# Patient Record
Sex: Female | Born: 1985
Health system: Southern US, Community
[De-identification: ages and names within clinical notes are randomized; demographics above are authoritative.]

## PROBLEM LIST (undated history)

## (undated) DIAGNOSIS — R55 Syncope and collapse: Secondary | ICD-10-CM

## (undated) DIAGNOSIS — E782 Mixed hyperlipidemia: Secondary | ICD-10-CM

## (undated) DIAGNOSIS — R748 Abnormal levels of other serum enzymes: Secondary | ICD-10-CM

## (undated) DIAGNOSIS — R7301 Impaired fasting glucose: Secondary | ICD-10-CM

## (undated) HISTORY — DX: Syncope and collapse: R55

## (undated) HISTORY — PX: OTHER SURGICAL HISTORY: SHX169

## (undated) HISTORY — DX: Impaired fasting glucose: R73.01

## (undated) HISTORY — DX: Mixed hyperlipidemia: E78.2

---
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
## 2010-03-08 DIAGNOSIS — R748 Abnormal levels of other serum enzymes: Secondary | ICD-10-CM

## 2010-03-08 HISTORY — DX: Abnormal levels of other serum enzymes: R74.8

## 2010-04-10 ENCOUNTER — Other Ambulatory Visit: Payer: Self-pay | Admitting: Obstetrics & Gynecology

## 2010-04-10 DIAGNOSIS — N6451 Induration of breast: Secondary | ICD-10-CM

## 2010-04-13 ENCOUNTER — Ambulatory Visit
Admission: RE | Admit: 2010-04-13 | Discharge: 2010-04-13 | Disposition: A | Discharge status: Home / Self Care | Payer: BC Managed Care – PPO | Source: Ambulatory Visit | Attending: Obstetrics & Gynecology | Admitting: Obstetrics & Gynecology

## 2010-04-13 DIAGNOSIS — N6451 Induration of breast: Secondary | ICD-10-CM

## 2010-11-04 LAB — RPR: RPR: NONREACTIVE

## 2010-11-04 LAB — HIV ANTIBODY (ROUTINE TESTING W REFLEX): HIV: NONREACTIVE

## 2010-11-04 LAB — HEPATITIS B SURFACE ANTIGEN: Hepatitis B Surface Ag: NEGATIVE

## 2010-11-04 LAB — RUBELLA ANTIBODY, IGM: Rubella: IMMUNE

## 2011-02-15 ENCOUNTER — Ambulatory Visit (HOSPITAL_COMMUNITY)
Admission: RE | Admit: 2011-02-15 | Discharge: 2011-02-15 | Disposition: A | Discharge status: Home / Self Care | Payer: BC Managed Care – PPO | Source: Ambulatory Visit | Attending: Obstetrics & Gynecology | Admitting: Obstetrics & Gynecology

## 2011-02-15 ENCOUNTER — Other Ambulatory Visit (HOSPITAL_COMMUNITY): Payer: Self-pay | Admitting: Obstetrics & Gynecology

## 2011-02-15 DIAGNOSIS — R1011 Right upper quadrant pain: Secondary | ICD-10-CM

## 2011-02-15 DIAGNOSIS — O99891 Other specified diseases and conditions complicating pregnancy: Secondary | ICD-10-CM | POA: Insufficient documentation

## 2011-03-08 ENCOUNTER — Inpatient Hospital Stay (HOSPITAL_COMMUNITY): Admission: AD | Admit: 2011-03-08 | Payer: Self-pay | Source: Ambulatory Visit | Admitting: Obstetrics & Gynecology

## 2011-06-15 ENCOUNTER — Encounter (HOSPITAL_COMMUNITY): Payer: Self-pay | Admitting: *Deleted

## 2011-06-15 ENCOUNTER — Inpatient Hospital Stay (HOSPITAL_COMMUNITY)
Admission: AD | Admit: 2011-06-15 | Discharge: 2011-06-17 | DRG: 370 | Disposition: A | Discharge status: Home / Self Care | Payer: BC Managed Care – PPO | Source: Ambulatory Visit | Attending: Obstetrics & Gynecology | Admitting: Obstetrics & Gynecology

## 2011-06-15 ENCOUNTER — Encounter (HOSPITAL_COMMUNITY)
Admission: AD | Disposition: A | Discharge status: Home / Self Care | Payer: Self-pay | Source: Ambulatory Visit | Attending: Obstetrics & Gynecology

## 2011-06-15 ENCOUNTER — Other Ambulatory Visit: Payer: Self-pay | Admitting: Obstetrics & Gynecology

## 2011-06-15 ENCOUNTER — Encounter (HOSPITAL_COMMUNITY): Payer: Self-pay | Admitting: Anesthesiology

## 2011-06-15 ENCOUNTER — Inpatient Hospital Stay (HOSPITAL_COMMUNITY): Payer: BC Managed Care – PPO | Admitting: Anesthesiology

## 2011-06-15 DIAGNOSIS — D62 Acute posthemorrhagic anemia: Secondary | ICD-10-CM | POA: Diagnosis not present

## 2011-06-15 DIAGNOSIS — O9903 Anemia complicating the puerperium: Secondary | ICD-10-CM | POA: Diagnosis not present

## 2011-06-15 DIAGNOSIS — O322XX0 Maternal care for transverse and oblique lie, not applicable or unspecified: Secondary | ICD-10-CM | POA: Diagnosis not present

## 2011-06-15 HISTORY — DX: Abnormal levels of other serum enzymes: R74.8

## 2011-06-15 LAB — CBC
MCH: 32.1 pg (ref 26.0–34.0)
MCV: 93.2 fL (ref 78.0–100.0)
Platelets: 208 10*3/uL (ref 150–400)
RDW: 13.5 % (ref 11.5–15.5)

## 2011-06-15 SURGERY — Surgical Case
Anesthesia: Spinal

## 2011-06-15 MED ORDER — SODIUM CHLORIDE 0.9 % IV SOLN
1.0000 ug/kg/h | INTRAVENOUS | Status: DC | PRN
  Filled 2011-06-15: qty 2.5

## 2011-06-15 MED ORDER — ACETAMINOPHEN 325 MG PO TABS: 650.0000 mg | ORAL_TABLET | ORAL | Status: DC | PRN

## 2011-06-15 MED ORDER — LACTATED RINGERS IV SOLN: 500.0000 mL | Freq: Once | INTRAVENOUS | Status: DC

## 2011-06-15 MED ORDER — ONDANSETRON HCL 4 MG/2ML IJ SOLN
INTRAMUSCULAR | Status: AC
  Filled 2011-06-15: qty 2

## 2011-06-15 MED ORDER — METOCLOPRAMIDE HCL 5 MG/ML IJ SOLN: 10.0000 mg | Freq: Three times a day (TID) | INTRAMUSCULAR | Status: DC | PRN

## 2011-06-15 MED ORDER — FENTANYL 2.5 MCG/ML BUPIVACAINE 1/10 % EPIDURAL INFUSION (WH - ANES): 14.0000 mL/h | INTRAMUSCULAR | Status: DC

## 2011-06-15 MED ORDER — KETOROLAC TROMETHAMINE 60 MG/2ML IM SOLN
INTRAMUSCULAR | Status: AC
  Administered 2011-06-15: 60 mg via INTRAMUSCULAR
  Filled 2011-06-15: qty 2

## 2011-06-15 MED ORDER — NALOXONE HCL 0.4 MG/ML IJ SOLN: 0.4000 mg | INTRAMUSCULAR | Status: DC | PRN

## 2011-06-15 MED ORDER — DIBUCAINE 1 % RE OINT: 1.0000 "application " | TOPICAL_OINTMENT | RECTAL | Status: DC | PRN

## 2011-06-15 MED ORDER — NALBUPHINE SYRINGE 5 MG/0.5 ML
5.0000 mg | INJECTION | INTRAMUSCULAR | Status: DC | PRN
  Filled 2011-06-15: qty 1

## 2011-06-15 MED ORDER — DIPHENHYDRAMINE HCL 50 MG/ML IJ SOLN: 25.0000 mg | INTRAMUSCULAR | Status: DC | PRN

## 2011-06-15 MED ORDER — OXYTOCIN 20 UNITS IN LACTATED RINGERS INFUSION - SIMPLE
INTRAVENOUS | Status: AC
  Filled 2011-06-15: qty 1000

## 2011-06-15 MED ORDER — NALBUPHINE SYRINGE 5 MG/0.5 ML
INJECTION | INTRAMUSCULAR | Status: AC
  Filled 2011-06-15: qty 0.5

## 2011-06-15 MED ORDER — ONDANSETRON HCL 4 MG/2ML IJ SOLN: 4.0000 mg | Freq: Three times a day (TID) | INTRAMUSCULAR | Status: DC | PRN

## 2011-06-15 MED ORDER — WITCH HAZEL-GLYCERIN EX PADS: 1.0000 "application " | MEDICATED_PAD | CUTANEOUS | Status: DC | PRN

## 2011-06-15 MED ORDER — PHENYLEPHRINE 40 MCG/ML (10ML) SYRINGE FOR IV PUSH (FOR BLOOD PRESSURE SUPPORT): 80.0000 ug | PREFILLED_SYRINGE | INTRAVENOUS | Status: DC | PRN

## 2011-06-15 MED ORDER — ONDANSETRON HCL 4 MG/2ML IJ SOLN: 4.0000 mg | INTRAMUSCULAR | Status: DC | PRN

## 2011-06-15 MED ORDER — LANOLIN HYDROUS EX OINT: 1.0000 "application " | TOPICAL_OINTMENT | CUTANEOUS | Status: DC | PRN

## 2011-06-15 MED ORDER — NALBUPHINE SYRINGE 5 MG/0.5 ML
5.0000 mg | INJECTION | INTRAMUSCULAR | Status: DC | PRN
  Administered 2011-06-15: 10 mg via SUBCUTANEOUS
  Filled 2011-06-15: qty 1

## 2011-06-15 MED ORDER — IBUPROFEN 600 MG PO TABS
600.0000 mg | ORAL_TABLET | Freq: Four times a day (QID) | ORAL | Status: DC | PRN
  Filled 2011-06-15 (×5): qty 1

## 2011-06-15 MED ORDER — ONDANSETRON HCL 4 MG PO TABS: 4.0000 mg | ORAL_TABLET | ORAL | Status: DC | PRN

## 2011-06-15 MED ORDER — KETOROLAC TROMETHAMINE 60 MG/2ML IM SOLN
60.0000 mg | Freq: Once | INTRAMUSCULAR | Status: AC | PRN
  Administered 2011-06-15: 60 mg via INTRAMUSCULAR

## 2011-06-15 MED ORDER — MENTHOL 3 MG MT LOZG: 1.0000 | LOZENGE | OROMUCOSAL | Status: DC | PRN

## 2011-06-15 MED ORDER — PHENYLEPHRINE HCL 10 MG/ML IJ SOLN
INTRAMUSCULAR | Status: DC | PRN
  Administered 2011-06-15: 40 ug via INTRAVENOUS
  Administered 2011-06-15 (×2): 80 ug via INTRAVENOUS

## 2011-06-15 MED ORDER — OXYTOCIN 20 UNITS IN LACTATED RINGERS INFUSION - SIMPLE: 125.0000 mL/h | INTRAVENOUS | Status: AC

## 2011-06-15 MED ORDER — OXYCODONE-ACETAMINOPHEN 5-325 MG PO TABS
1.0000 | ORAL_TABLET | ORAL | Status: DC | PRN
  Administered 2011-06-16: 1 via ORAL
  Administered 2011-06-16: 2 via ORAL
  Administered 2011-06-17: 1 via ORAL
  Filled 2011-06-15: qty 1
  Filled 2011-06-15: qty 2
  Filled 2011-06-15: qty 1

## 2011-06-15 MED ORDER — PHENYLEPHRINE 40 MCG/ML (10ML) SYRINGE FOR IV PUSH (FOR BLOOD PRESSURE SUPPORT)
PREFILLED_SYRINGE | INTRAVENOUS | Status: AC
  Filled 2011-06-15: qty 5

## 2011-06-15 MED ORDER — OXYTOCIN 20 UNITS IN LACTATED RINGERS INFUSION - SIMPLE
1.0000 m[IU]/min | INTRAVENOUS | Status: DC
  Administered 2011-06-15: 2 m[IU]/min via INTRAVENOUS
  Filled 2011-06-15: qty 1000

## 2011-06-15 MED ORDER — FENTANYL CITRATE 0.05 MG/ML IJ SOLN
INTRAMUSCULAR | Status: AC
  Filled 2011-06-15: qty 2

## 2011-06-15 MED ORDER — OXYTOCIN BOLUS FROM INFUSION
500.0000 mL | Freq: Once | INTRAVENOUS | Status: DC
  Filled 2011-06-15: qty 500

## 2011-06-15 MED ORDER — FLEET ENEMA 7-19 GM/118ML RE ENEM: 1.0000 | ENEMA | RECTAL | Status: DC | PRN

## 2011-06-15 MED ORDER — TERBUTALINE SULFATE 1 MG/ML IJ SOLN: 0.2500 mg | Freq: Once | INTRAMUSCULAR | Status: DC | PRN

## 2011-06-15 MED ORDER — SCOPOLAMINE 1 MG/3DAYS TD PT72
1.0000 | MEDICATED_PATCH | Freq: Once | TRANSDERMAL | Status: DC
  Filled 2011-06-15: qty 1

## 2011-06-15 MED ORDER — OXYTOCIN 10 UNIT/ML IJ SOLN
INTRAMUSCULAR | Status: AC
  Filled 2011-06-15: qty 2

## 2011-06-15 MED ORDER — KETOROLAC TROMETHAMINE 30 MG/ML IJ SOLN: 30.0000 mg | Freq: Four times a day (QID) | INTRAMUSCULAR | Status: AC | PRN

## 2011-06-15 MED ORDER — LACTATED RINGERS IV SOLN: 500.0000 mL | INTRAVENOUS | Status: DC | PRN

## 2011-06-15 MED ORDER — CITRIC ACID-SODIUM CITRATE 334-500 MG/5ML PO SOLN
30.0000 mL | ORAL | Status: DC | PRN
  Administered 2011-06-15: 30 mL via ORAL
  Filled 2011-06-15: qty 15

## 2011-06-15 MED ORDER — DIPHENHYDRAMINE HCL 25 MG PO CAPS: 25.0000 mg | ORAL_CAPSULE | ORAL | Status: DC | PRN

## 2011-06-15 MED ORDER — EPHEDRINE SULFATE 50 MG/ML IJ SOLN
INTRAMUSCULAR | Status: DC | PRN
  Administered 2011-06-15: 10 mg via INTRAVENOUS

## 2011-06-15 MED ORDER — LIDOCAINE HCL (PF) 1 % IJ SOLN: 30.0000 mL | INTRAMUSCULAR | Status: DC | PRN

## 2011-06-15 MED ORDER — MORPHINE SULFATE (PF) 0.5 MG/ML IJ SOLN
INTRAMUSCULAR | Status: DC | PRN
  Administered 2011-06-15: .1 ug via INTRATHECAL

## 2011-06-15 MED ORDER — EPHEDRINE 5 MG/ML INJ
INTRAVENOUS | Status: AC
  Filled 2011-06-15: qty 10

## 2011-06-15 MED ORDER — ZOLPIDEM TARTRATE 5 MG PO TABS: 5.0000 mg | ORAL_TABLET | Freq: Every evening | ORAL | Status: DC | PRN

## 2011-06-15 MED ORDER — DIPHENHYDRAMINE HCL 50 MG/ML IJ SOLN: 12.5000 mg | INTRAMUSCULAR | Status: DC | PRN

## 2011-06-15 MED ORDER — SIMETHICONE 80 MG PO CHEW: 80.0000 mg | CHEWABLE_TABLET | ORAL | Status: DC | PRN

## 2011-06-15 MED ORDER — EPHEDRINE 5 MG/ML INJ: 10.0000 mg | INTRAVENOUS | Status: DC | PRN

## 2011-06-15 MED ORDER — OXYCODONE-ACETAMINOPHEN 5-325 MG PO TABS: 1.0000 | ORAL_TABLET | ORAL | Status: DC | PRN

## 2011-06-15 MED ORDER — IBUPROFEN 600 MG PO TABS: 600.0000 mg | ORAL_TABLET | Freq: Four times a day (QID) | ORAL | Status: DC | PRN

## 2011-06-15 MED ORDER — BUPIVACAINE IN DEXTROSE 0.75-8.25 % IT SOLN
INTRATHECAL | Status: DC | PRN
  Administered 2011-06-15: 1.5 mL via INTRATHECAL

## 2011-06-15 MED ORDER — ONDANSETRON HCL 4 MG/2ML IJ SOLN
INTRAMUSCULAR | Status: DC | PRN
  Administered 2011-06-15: 4 mg via INTRAVENOUS

## 2011-06-15 MED ORDER — FENTANYL CITRATE 0.05 MG/ML IJ SOLN: 25.0000 ug | INTRAMUSCULAR | Status: DC | PRN

## 2011-06-15 MED ORDER — CEFAZOLIN SODIUM 1-5 GM-% IV SOLN
1.0000 g | Freq: Once | INTRAVENOUS | Status: DC
  Administered 2011-06-15: 1 g via INTRAVENOUS
  Filled 2011-06-15: qty 50

## 2011-06-15 MED ORDER — MORPHINE SULFATE 0.5 MG/ML IJ SOLN
INTRAMUSCULAR | Status: AC
  Filled 2011-06-15: qty 10

## 2011-06-15 MED ORDER — LACTATED RINGERS IV SOLN
INTRAVENOUS | Status: DC
  Administered 2011-06-15: 125 mL/h via INTRAVENOUS
  Administered 2011-06-15 (×2): via INTRAVENOUS
  Administered 2011-06-15: 125 mL/h via INTRAVENOUS

## 2011-06-15 MED ORDER — CEFAZOLIN SODIUM 1-5 GM-% IV SOLN
INTRAVENOUS | Status: AC
  Filled 2011-06-15: qty 50

## 2011-06-15 MED ORDER — LACTATED RINGERS IV SOLN
INTRAVENOUS | Status: DC
  Administered 2011-06-16: 03:00:00 via INTRAVENOUS

## 2011-06-15 MED ORDER — FENTANYL CITRATE 0.05 MG/ML IJ SOLN
INTRAMUSCULAR | Status: DC | PRN
  Administered 2011-06-15: 15 ug via INTRATHECAL

## 2011-06-15 MED ORDER — TETANUS-DIPHTH-ACELL PERTUSSIS 5-2.5-18.5 LF-MCG/0.5 IM SUSP
0.5000 mL | Freq: Once | INTRAMUSCULAR | Status: AC
  Administered 2011-06-16: 0.5 mL via INTRAMUSCULAR
  Filled 2011-06-15: qty 0.5

## 2011-06-15 MED ORDER — SENNOSIDES-DOCUSATE SODIUM 8.6-50 MG PO TABS
2.0000 | ORAL_TABLET | Freq: Every day | ORAL | Status: DC
  Administered 2011-06-15 – 2011-06-16 (×2): 2 via ORAL

## 2011-06-15 MED ORDER — PRENATAL MULTIVITAMIN CH
1.0000 | ORAL_TABLET | Freq: Every day | ORAL | Status: DC
  Administered 2011-06-16 – 2011-06-17 (×2): 1 via ORAL
  Filled 2011-06-15 (×2): qty 1

## 2011-06-15 MED ORDER — SIMETHICONE 80 MG PO CHEW
80.0000 mg | CHEWABLE_TABLET | Freq: Three times a day (TID) | ORAL | Status: DC
  Administered 2011-06-15 – 2011-06-17 (×6): 80 mg via ORAL

## 2011-06-15 MED ORDER — NALBUPHINE SYRINGE 5 MG/0.5 ML
INJECTION | INTRAMUSCULAR | Status: AC
  Administered 2011-06-15: 10 mg via SUBCUTANEOUS
  Filled 2011-06-15: qty 0.5

## 2011-06-15 MED ORDER — IBUPROFEN 600 MG PO TABS
600.0000 mg | ORAL_TABLET | Freq: Four times a day (QID) | ORAL | Status: DC
  Administered 2011-06-16 – 2011-06-17 (×7): 600 mg via ORAL
  Filled 2011-06-15 (×2): qty 1

## 2011-06-15 MED ORDER — OXYTOCIN 20 UNITS IN LACTATED RINGERS INFUSION - SIMPLE: 125.0000 mL/h | Freq: Once | INTRAVENOUS | Status: DC

## 2011-06-15 MED ORDER — SODIUM CHLORIDE 0.9 % IJ SOLN: 3.0000 mL | INTRAMUSCULAR | Status: DC | PRN

## 2011-06-15 MED ORDER — OXYTOCIN 20 UNITS IN LACTATED RINGERS INFUSION - SIMPLE
INTRAVENOUS | Status: DC | PRN
  Administered 2011-06-15: 20 [IU] via INTRAVENOUS

## 2011-06-15 MED ORDER — DIPHENHYDRAMINE HCL 25 MG PO CAPS: 25.0000 mg | ORAL_CAPSULE | Freq: Four times a day (QID) | ORAL | Status: DC | PRN

## 2011-06-15 MED ORDER — ONDANSETRON HCL 4 MG/2ML IJ SOLN: 4.0000 mg | Freq: Four times a day (QID) | INTRAMUSCULAR | Status: DC | PRN

## 2011-06-15 MED ORDER — MEPERIDINE HCL 25 MG/ML IJ SOLN: 6.2500 mg | INTRAMUSCULAR | Status: DC | PRN

## 2011-06-15 SURGICAL SUPPLY — 37 items
BENZOIN TINCTURE PRP APPL 2/3 (GAUZE/BANDAGES/DRESSINGS) IMPLANT
CHLORAPREP W/TINT 26ML (MISCELLANEOUS) ×2 IMPLANT
CLOTH BEACON ORANGE TIMEOUT ST (SAFETY) ×2 IMPLANT
CONTAINER PREFILL 10% NBF 15ML (MISCELLANEOUS) IMPLANT
DRESSING TELFA 8X3 (GAUZE/BANDAGES/DRESSINGS) ×2 IMPLANT
ELECT REM PT RETURN 9FT ADLT (ELECTROSURGICAL) ×2
ELECTRODE REM PT RTRN 9FT ADLT (ELECTROSURGICAL) ×1 IMPLANT
EXTRACTOR VACUUM KIWI (MISCELLANEOUS) IMPLANT
EXTRACTOR VACUUM M CUP 4 TUBE (SUCTIONS) IMPLANT
GAUZE SPONGE 4X4 12PLY STRL LF (GAUZE/BANDAGES/DRESSINGS) ×4 IMPLANT
GLOVE BIO SURGEON STRL SZ7 (GLOVE) ×2 IMPLANT
GLOVE BIOGEL PI IND STRL 7.0 (GLOVE) ×2 IMPLANT
GLOVE BIOGEL PI INDICATOR 7.0 (GLOVE) ×2
GOWN PREVENTION PLUS LG XLONG (DISPOSABLE) ×6 IMPLANT
KIT ABG SYR 3ML LUER SLIP (SYRINGE) IMPLANT
NEEDLE HYPO 25X5/8 SAFETYGLIDE (NEEDLE) IMPLANT
NS IRRIG 1000ML POUR BTL (IV SOLUTION) ×2 IMPLANT
PACK C SECTION WH (CUSTOM PROCEDURE TRAY) ×2 IMPLANT
PAD ABD 7.5X8 STRL (GAUZE/BANDAGES/DRESSINGS) ×2 IMPLANT
RTRCTR C-SECT PINK 25CM LRG (MISCELLANEOUS) IMPLANT
SLEEVE SCD COMPRESS KNEE MED (MISCELLANEOUS) IMPLANT
STAPLER VISISTAT 35W (STAPLE) IMPLANT
STRIP CLOSURE SKIN 1/4X4 (GAUZE/BANDAGES/DRESSINGS) IMPLANT
SUT MNCRL 0 VIOLET CTX 36 (SUTURE) ×3 IMPLANT
SUT MONOCRYL 0 CTX 36 (SUTURE) ×3
SUT PLAIN 0 NONE (SUTURE) IMPLANT
SUT PLAIN 2 0 (SUTURE)
SUT PLAIN ABS 2-0 CT1 27XMFL (SUTURE) IMPLANT
SUT VIC AB 0 CT1 27 (SUTURE) ×2
SUT VIC AB 0 CT1 27XBRD ANBCTR (SUTURE) ×2 IMPLANT
SUT VIC AB 2-0 CT1 27 (SUTURE) ×2
SUT VIC AB 2-0 CT1 TAPERPNT 27 (SUTURE) ×2 IMPLANT
SUT VIC AB 4-0 KS 27 (SUTURE) IMPLANT
SUT VICRYL 0 TIES 12 18 (SUTURE) IMPLANT
TOWEL OR 17X24 6PK STRL BLUE (TOWEL DISPOSABLE) ×4 IMPLANT
TRAY FOLEY CATH 14FR (SET/KITS/TRAYS/PACK) IMPLANT
WATER STERILE IRR 1000ML POUR (IV SOLUTION) ×2 IMPLANT

## 2011-06-15 NOTE — Progress Notes (Signed)
1314 FHR decreased to 90 bpm for 3 minutes then gradually returned to baseline.  Dr. Juliene Pina notified.

## 2011-06-15 NOTE — Progress Notes (Signed)
Dr. Juliene Pina at bedside and notified of pf status, SVE, FHR, prolonged deceleration, RN interventions, oxygen applied, UC pattern, and pitocin amount.  Dr. Juliene Pina explained to pt and family the possible need for a c-section delivery.  Pt and family verbalized understandings, will continue to monitor.

## 2011-06-15 NOTE — Progress Notes (Signed)
Dr. Juliene Pina notified of pt status, FHR, decreased variability, late decelerations noted, UC pattern, RN interventions, and pitocin amount.  Orders received to D/C pitocin and prep pt for a c-section delivery.  Will continue to monitor.

## 2011-06-15 NOTE — H&P (Signed)
JASARA CORRIGAN is a 26 y.o. female presenting at 40 wks for IOL trial for oblique lie/ floating head to see if contractions will help fetal engagement. Patient requesting IOL Ob course complicated by elevated LFT, that normalized after n/v resolved. High wt gain. Normal fetal growth.   Maternal Medical History:  Fetal activity: Perceived fetal activity is normal.    Prenatal complications: No bleeding or hypertension.     OB History    Grav Para Term Preterm Abortions TAB SAB Ect Mult Living   1 0 0 0 0 0 0 0 0 0      Past Medical History  Diagnosis Date  . Elevated liver enzymes 2012   Past Surgical History  Procedure Date  . Wisdom tooth extraction 2008   Family History: family history includes Cancer in her mother and Heart failure in her maternal grandmother. Social History:  reports that she has never smoked. She has never used smokeless tobacco. She reports that she does not drink alcohol or use illicit drugs.  ROS  No HA/vision changes/ RUQ pain/LE swelling.    Exam Physical Exam  Blood pressure 121/75, pulse 89, temperature 98.1 F (36.7 C), temperature source Oral, resp. rate 18, height 5' (1.524 m), weight 69.854 kg (154 lb).  A&O x 3, no acute distress. Pleasant HEENT neg, no thyromegaly Lungs CTA bilat CV RRR, S1S2 normal Abdo soft, non tender, non acute, Vtx, ballotable, oblique lie.  Extr no edema/ tenderness Pelvic Dilation: 1.5, Effacement (%): 40, Station: -3/ballotable (Exam by:: Dr. Juliene Pina  FHT  140s/ + accesl/ no decels. Mod variab. Intermittent variable decels with few late decels with UCs.  Toco q 2-4 min on pitocin that was turned off since late decels noted.   Prenatal labs: ABO, Rh: A/Positive/-- (08/29 0000) Antibody: Negative (08/29 0000) Rubella: Immune (08/29 0000) RPR: Nonreactive (08/29 0000)  HBsAg: Negative (08/29 0000)  HIV: Non-reactive (08/29 0000)  GBS: Negative (03/15 0000)  Sono - normal female, nl placenta and cord.  Screening  QUAD negative.   Assessment/Plan: 26 yo, G1 at 40 wks, IOL for oblique lie, attempt to convert to longitudinal lie and assess for fetal descent. None noted. FHT noted variable decels and some late decels with ongoing UCs, hence pitocin turned off and we will proceed with c-section.   Risks/complications of surgery reviewed incl infection, bleeding, damage to internal organs including bladder, bowels, ureters, blood vessels, other risks from anesthesia, VTE and delayed complications of any surgery, complications in future surgery reviewed. Also discussed neonatal complications incl difficult delivery, laceration, vacuum assistance, TTN etc. Pt understands and agrees, all concerns addressed.     Bardia Wangerin R 06/15/2011, 3:36 PM

## 2011-06-15 NOTE — Anesthesia Procedure Notes (Signed)
Spinal  Patient location during procedure: OR Start time: 06/15/2011 4:07 PM Staffing Performed by: anesthesiologist  Preanesthetic Checklist Completed: patient identified, site marked, surgical consent, pre-op evaluation, timeout performed, IV checked, risks and benefits discussed and monitors and equipment checked Spinal Block Patient position: sitting Prep: site prepped and draped and DuraPrep Patient monitoring: heart rate, cardiac monitor, continuous pulse ox and blood pressure Approach: midline Location: L3-4 Injection technique: single-shot Needle Needle type: Sprotte  Needle gauge: 24 G Needle length: 9 cm Assessment Sensory level: T4 Additional Notes Clear free flow CSF on first attempt.  No paresthesia.  Patient tolerated procedure well. Jasmine December, MD

## 2011-06-15 NOTE — Anesthesia Preprocedure Evaluation (Signed)
Anesthesia Evaluation  Patient identified by MRN, date of birth, ID band Patient awake    Reviewed: Allergy & Precautions, H&P , NPO status , Patient's Chart, lab work & pertinent test results, reviewed documented beta blocker date and time   History of Anesthesia Complications Negative for: history of anesthetic complications  Airway Mallampati: III TM Distance: >3 FB Neck ROM: full    Dental  (+) Teeth Intact   Pulmonary neg pulmonary ROS,  breath sounds clear to auscultation        Cardiovascular negative cardio ROS  Rhythm:regular Rate:Normal     Neuro/Psych negative neurological ROS  negative psych ROS   GI/Hepatic negative GI ROS, Neg liver ROS,   Endo/Other  negative endocrine ROS  Renal/GU negative Renal ROS     Musculoskeletal   Abdominal   Peds  Hematology negative hematology ROS (+)   Anesthesia Other Findings   Reproductive/Obstetrics (+) Pregnancy                           Anesthesia Physical Anesthesia Plan  ASA: II  Anesthesia Plan: Spinal   Post-op Pain Management:    Induction:   Airway Management Planned:   Additional Equipment:   Intra-op Plan:   Post-operative Plan:   Informed Consent: I have reviewed the patients History and Physical, chart, labs and discussed the procedure including the risks, benefits and alternatives for the proposed anesthesia with the patient or authorized representative who has indicated his/her understanding and acceptance.     Plan Discussed with:   Anesthesia Plan Comments:         Anesthesia Quick Evaluation

## 2011-06-15 NOTE — Op Note (Signed)
Cesarean Section Procedure Note   Christine Garner, 06/15/2011  Indications: Oblique lie, fetal intolerance to contractions   Pre-operative Diagnosis: 40 wks, Oblique Lie.  Post-operative Diagnosis: Same  Surgeon: Surgeon(s) and Role:  Robley Fries, MD - Primary  Assistants: none Anesthesia: spinal   Procedure Details:  The patient was seen in labor room. Patient had pitocin induced contractions to see if fetal lie would turn to longitudinal from oblique lie and allow vaginal delivery attempt. FHT noted noted variable and occasional late decelerations and no fetal engagement noted. Pitocin was stopped and cesarean delivery was advised. The risks, benefits, complications, treatment options, and expected outcomes were discussed with the patient. The patient concurred with the proposed plan, giving informed consent. identified as Christine Garner and the procedure verified as C-Section Delivery. She was brought to the Operating room. A Time Out was held and the above information confirmed. She received 1 gm Ancef IV.  After induction of spinal anesthesia, the patient was prepped in the usual sterile manner, foley was placed and she was draped in usual sterile fashion.  A Pfannenstiel incision was made and carried down through the subcutaneous tissue to the fascia. Fascial incision was made and extended transversely. The fascia was separated from the underlying rectus tissue superiorly and inferiorly. The peritoneum was identified and entered. Peritoneal incision was extended longitudinally. The utero-vesical peritoneal reflection was incised transversely and the bladder flap was bluntly freed from the lower uterine segment. A low transverse uterine incision was made. Amniotic fluid was clear. Delivered from cephalic presentation (ROT) was a healthy, vigorous FEMALE infant. Apgar scores of 9 at one minute and 9 at five minutes. Cord ph was not sent the umbilical cord was clamped and cut cord blood was  obtained for evaluation. The placenta was removed Intact and appeared normal. The uterine outline, tubes and ovaries appeared normal}. The uterine incision was closed with running locked sutures of 0Vicryl followed by imbricating layer.  Hemostasis was observed. Lavage was carried out until clear. Peritoneum closed using 2-0 Vicryl. Pyramidalis sutured in midline. The fascia was then reapproximated with running sutures of 0Vicryl. The subcuticular closure was performed using 3-0Vicryl. Steristrips applied and sterile dressing placed.  Instrument, sponge, and needle counts were correct prior the abdominal closure and were correct at the conclusion of the case.   Findings: Female infant, delivered cephalic, was noted in ROT, floating position. Apgars 9/9, Weight pending. Normal placenta, 3 vessel cord. Normal tubes and ovaries.    Estimated Blood Loss: 800 cc Total IV Fluids: 2500 cc  Urine Output: 300 cc clear urine Specimens: Cord blood.   Complications: no complications Disposition: PACU - hemodynamically stable.  Maternal Condition: stable  Baby condition / location:  nursery-stable  Attending Attestation: I performed the procedure.   Signed: Surgeon(s): Robley Fries, MD

## 2011-06-15 NOTE — Transfer of Care (Signed)
Immediate Anesthesia Transfer of Care Note  Patient: Christine Garner  Procedure(s) Performed: Procedure(s) (LRB): CESAREAN SECTION (N/A)  Patient Location: PACU  Anesthesia Type: Spinal  Level of Consciousness: awake  Airway & Oxygen Therapy: Patient Spontanous Breathing  Post-op Assessment: Report given to PACU RN  Post vital signs: Reviewed and stable  Complications: No apparent anesthesia complications

## 2011-06-15 NOTE — Anesthesia Postprocedure Evaluation (Signed)
Anesthesia Post Note  Patient: Christine Garner  Procedure(s) Performed: Procedure(s) (LRB): CESAREAN SECTION (N/A)  Anesthesia type: Spinal  Patient location: PACU  Post pain: Pain level controlled  Post assessment: Post-op Vital signs reviewed  Last Vitals:  Filed Vitals:   06/15/11 1745  BP:   Pulse:   Temp:   Resp: 18    Post vital signs: Reviewed  Level of consciousness: awake  Complications: No apparent anesthesia complications

## 2011-06-16 ENCOUNTER — Encounter (HOSPITAL_COMMUNITY): Payer: Self-pay | Admitting: Obstetrics & Gynecology

## 2011-06-16 LAB — CBC
HCT: 30 % — ABNORMAL LOW (ref 36.0–46.0)
Hemoglobin: 10.1 g/dL — ABNORMAL LOW (ref 12.0–15.0)
MCV: 94.3 fL (ref 78.0–100.0)
Platelets: 168 10*3/uL (ref 150–400)
RBC: 3.18 MIL/uL — ABNORMAL LOW (ref 3.87–5.11)
WBC: 11.1 10*3/uL — ABNORMAL HIGH (ref 4.0–10.5)

## 2011-06-16 NOTE — Progress Notes (Signed)
Patient ID: VALOREE AGENT, female   DOB: 1986/02/21, 26 y.o.   MRN: [id number]  POSTOPERATIVE DAY # 1 S/P cesarean section   S:         Reports feeling tired             Tolerating po intake / no nausea / no vomiting / no flatus / no BM             Bleeding is light             Pain controlled with long acting narcotic             Up ad lib / ambulatory  Newborn bottle-feeding  / female newborn   O:  A & O x 3              VS: Blood pressure 115/73, pulse 76, temperature 98.2 F (36.8 C), temperature source Oral, resp. rate 18, height 5' (1.524 m), weight 69.854 kg (154 lb), SpO2 98.00%, unknown if currently breastfeeding.  LABS: WBC/Hgb/Hct/Plts:  11.1/10.1/30.0/168 (04/10 0540)   I&O:   negative  Lungs: Clear and unlabored  Heart: regular rate and rhythm / no mumurs  Abdomen: soft, non-tender, non-distended, bowel sounds active             Fundus: firm, non-tender, U-1             Dressing intact             Perineum: no edema  Lochia: light  Extremities: no edema, no calf pain or tenderness  A:        POD # 1 S/P cesarean section            Mild acute blood anemia - stable hemodynamically  P:        Routine postoperative care              Advance activity as tolerated   Shadi Sessler 06/16/2011, 8:47 AM

## 2011-06-16 NOTE — Anesthesia Postprocedure Evaluation (Signed)
  Anesthesia Post Note  Patient: Christine Garner  Procedure(s) Performed: Procedure(s) (LRB): CESAREAN SECTION (N/A)  Anesthesia type: Spinal  Patient location: Mother/Baby  Post pain: Pain level controlled  Post assessment: Post-op Vital signs reviewed  Last Vitals:  Filed Vitals:   06/16/11 0645  BP: 115/73  Pulse: 76  Temp: 36.8 C  Resp: 18    Post vital signs: Reviewed  Level of consciousness: awake  Complications: No apparent anesthesia complications

## 2011-06-16 NOTE — Addendum Note (Signed)
Addendum  created 06/16/11 1407 by Algis Greenhouse, CRNA   Modules edited:Notes Section

## 2011-06-17 MED ORDER — OXYCODONE-ACETAMINOPHEN 5-325 MG PO TABS: 1.0000 | ORAL_TABLET | ORAL | Status: AC | PRN

## 2011-06-17 MED ORDER — IBUPROFEN 600 MG PO TABS: 600.0000 mg | ORAL_TABLET | Freq: Four times a day (QID) | ORAL | Status: AC | PRN

## 2011-06-17 NOTE — Discharge Summary (Signed)
Pt seen, doing well, agree w above note and plan

## 2011-06-17 NOTE — Progress Notes (Signed)
duplicate

## 2011-06-17 NOTE — Progress Notes (Signed)
Patient ID: Christine Garner, female   DOB: 1985/03/24, 26 y.o.   MRN: [id number]  POSTOPERATIVE DAY # 2 S/P cesarean section   S:         Reports feeling well - desires discharge home today             Tolerating po intake / no nausea / no vomiting / + flatus / no BM             Bleeding is light             Pain controlled with motrin and percocet             Up ad lib / ambulatory / voiding QS  Newborn bottle- feeding only     O:  A & O x 3 NAD             VS: Blood pressure 103/67, pulse 75, temperature 98.1 F (36.7 C), temperature source Oral, resp. rate 18, height 5' (1.524 m), weight 69.854 kg (154 lb), SpO2 98.00%, unknown if currently breastfeeding.  Lungs: Clear and unlabored  Heart: regular rate and rhythm / no mumurs  Abdomen: soft, non-tender, non-distended, bowel sounds active             Fundus: firm, non-tender, U-1             Dressing OFF              Incision:  approximated with subcuticular / no erythema / no ecchymosis / no drainage  Perineum: no edema  Lochia: light  Extremities: trace edema, no calf pain or tenderness, negative Homans  A:        POD # 2 S/P cesarean section            Stable status  P:        Routine postoperative care              Discharge home - [facility] instructions reviewed     Marlinda Mike 06/17/2011, 8:39 AM

## 2011-06-17 NOTE — Discharge Summary (Signed)
POSTOPERATIVE DISCHARGE SUMMARY:  Patient ID: Christine Garner MRN: [id number] DOB/AGE: [birth year] 25 y.o.  Admit date: 06/15/2011 Discharge date:  06/17/2011  Admission Diagnoses: 39 weeks oblique lie - elective induction of labor    Discharge Diagnoses:   Term Pregnancy-delivered  POD 2 s/p cesarean section (fetal intolerance to labor)  Prenatal history: G1P1001   EDC : 06/16/2011, by Other Basis  Prenatal care at [facility]  Primary provider : Mody Prenatal course complicated by N&V with elevated LFT - resolved / oblique lie / excessive maternal weight gain (40 pounds)  Prenatal Labs: ABO, Rh: A (08/29 0000) positive Antibody: Negative (08/29 0000) Rubella: Immune (08/29 0000)  RPR: NON REACTIVE (04/09 1045)  HBsAg: Negative (08/29 0000)  HIV: Non-reactive (08/29 0000)  GBS: Negative (03/15 0000)  1 hr Glucola :nl   Medical / Surgical History :  Past medical history:  Past Medical History  Diagnosis Date  . Elevated liver enzymes 2012    Past surgical history:  Past Surgical History  Procedure Date  . Wisdom tooth extraction 2008  . Cesarean section 06/15/2011    Procedure: CESAREAN SECTION;  Surgeon: Robley Fries, MD;  Location: [facility];  Service: Gynecology;  Laterality: N/A;    Family History:  Family History  Problem Relation Age of Onset  . Cancer Mother     uterine  . Heart failure Maternal Grandmother     Social History:  reports that she has never smoked. She has never used smokeless tobacco. She reports that she does not drink alcohol or use illicit drugs.   Allergies: Review of patient's allergies indicates no known allergies.    Current Medications at time of admission:   Prior to Admission medications   Medication Sig Start Date End Date Taking? Authorizing Provider  Prenatal Vit-Fe Fumarate-FA (PRENATAL MULTIVITAMIN) TABS Take 1 tablet by mouth daily.   Yes Historical Provider, MD                    Intrapartum  Course: Admit for IOL - oblique lie with high fetal station / pitocin protocol / AROM / FHR decels - fetal intolerance to labor.  Procedures: Cesarean section delivery of female newborn by Dr Juliene Pina  See operative report for further details  Postoperative / postpartum course: uneventful - early discharge POD 2  Physical Exam:  Normal limits - trace dependent edema / no evidence of DVT / Lungs clear VSS: Blood pressure 103/67, pulse 75, temperature 98.1 F (36.7 C), temperature source Oral, resp. rate 18, height 5' (1.524 m), weight 69.854 kg (154 lb), SpO2 98.00%, unknown if currently breastfeeding.  LABS:   preop hgb: 12.8 /   Postop hgb: 10.1   Incision:  approximated with subcuticular & steri-strips / no erythema / no ecchymosis / nodrainage  Discharge Instructions:  Discharged Condition: stable Activity: pelvic rest and postoperative restrictions x 2 weeks Diet: routine Medications: see below Medication List  As of 06/17/2011  8:44 AM   TAKE these medications         ibuprofen 600 MG tablet   Commonly known as: ADVIL,MOTRIN   Take 1 tablet (600 mg total) by mouth every 6 (six) hours as needed.      oxyCODONE-acetaminophen 5-325 MG per tablet   Commonly known as: PERCOCET   Take 1-2 tablets by mouth every 4 (four) hours as needed (moderate - severe pain).      prenatal multivitamin Tabs   Take 1 tablet by mouth daily.  Condition: stable Postpartum Instructions: refer to practice specific booklet Discharge to: home Disposition: Final discharge disposition not confirmed Follow up :  Follow-up Information    Follow up with MODY,VAISHALI R, MD. Schedule an appointment as soon as possible for a visit in 6 weeks.   Contact information:   [address] [place] [address] [phone]           Signed: Marlinda Mike 06/17/2011, 8:44 AM

## 2012-04-03 ENCOUNTER — Ambulatory Visit (INDEPENDENT_AMBULATORY_CARE_PROVIDER_SITE_OTHER): Payer: BC Managed Care – PPO | Admitting: Family Medicine

## 2012-04-03 VITALS — BP 115/78 | HR 83 | Temp 98.5°F | Resp 16 | Ht 60.0 in | Wt 116.0 lb

## 2012-04-03 DIAGNOSIS — R509 Fever, unspecified: Secondary | ICD-10-CM

## 2012-04-03 DIAGNOSIS — J069 Acute upper respiratory infection, unspecified: Secondary | ICD-10-CM

## 2012-04-03 NOTE — Patient Instructions (Addendum)
Viral Syndrome  You or your child has Viral Syndrome. It is the most common infection causing "colds" and infections in the nose, throat, sinuses, and breathing tubes. Sometimes the infection causes nausea, vomiting, or diarrhea. The germ that causes the infection is a virus. No antibiotic or other medicine will kill it. There are medicines that you can take to make you or your child more comfortable.   HOME CARE INSTRUCTIONS    Rest in bed until you start to feel better.   If you have diarrhea or vomiting, eat small amounts of crackers and toast. Soup is helpful.   Do not give aspirin or medicine that contains aspirin to children.   Only take over-the-counter or prescription medicines for pain, discomfort, or fever as directed by your caregiver.  SEEK IMMEDIATE MEDICAL CARE IF:    You or your child has not improved within one week.   You or your child has pain that is not at least partially relieved by over-the-counter medicine.   Thick, colored mucus or blood is coughed up.   Discharge from the nose becomes thick yellow or green.   Diarrhea or vomiting gets worse.   There is any major change in your or your child's condition.   You or your child develops a skin rash, stiff neck, severe headache, or are unable to hold down food or fluid.   You or your child has an oral temperature above 102 F (38.9 C), not controlled by medicine.   Your baby is older than 3 months with a rectal temperature of 102 F (38.9 C) or higher.   Your baby is 3 months old or younger with a rectal temperature of 100.4 F (38 C) or higher.  Document Released: 02/07/2006 Document Revised: 05/17/2011 Document Reviewed: 02/08/2007  Exit[employer] Patient Information 2013 [employer].

## 2012-04-03 NOTE — Progress Notes (Signed)
Subjective:    Patient ID: Christine Garner, female    DOB: September 01, 1985, 27 y.o.   MRN: [id number]  HPI  Christine Garner is a delightful 27 yo woman who has a 25 mo baby girl at home - [place]. She did not get her flu shot this yr and she does work in Science [employer].  She began to get ill with nasal congestion, fatigue, scratchy throat about 3d prev but yesterday she developed neck pain and stiffness which has worsened and today she had a temp up to 99.8 so decided to come in for a flu test to ensure she was not exposing her infant.  Past Medical History  Diagnosis Date  . Elevated liver enzymes 2012   Current Outpatient Prescriptions on File Prior to Visit  Medication Sig Dispense Refill  . Drospirenone-Ethinyl Estradiol-Levomefol (BEYAZ) 3-0.02-0.451 MG tablet Take 1 tablet by mouth daily.      . Prenatal Vit-Fe Fumarate-FA (PRENATAL MULTIVITAMIN) TABS Take 1 tablet by mouth daily.       No Known Allergies  Review of Systems  Constitutional: Positive for chills, activity change and fatigue. Negative for fever, diaphoresis and appetite change.  HENT: Positive for congestion, sore throat, neck pain and neck stiffness. Negative for ear pain, nosebleeds, rhinorrhea, sneezing, mouth sores, trouble swallowing, voice change, postnasal drip, sinus pressure and ear discharge.   Respiratory: Positive for cough. Negative for shortness of breath.   Cardiovascular: Negative for chest pain.  Gastrointestinal: Negative for nausea, vomiting, abdominal pain, diarrhea and constipation.  Genitourinary: Negative for dysuria, frequency and decreased urine volume.  Musculoskeletal: Positive for myalgias. Negative for back pain, joint swelling, arthralgias and gait problem.  Neurological: Positive for headaches. Negative for dizziness, syncope, weakness and light-headedness.  Hematological: Negative for adenopathy.  Psychiatric/Behavioral: Positive for sleep disturbance.      BP 115/78  Pulse 83  Temp 98.5 F (36.9  C)  Resp 16  Ht 5' (1.524 m)  Wt 116 lb (52.617 kg)  BMI 22.65 kg/m2  SpO2 100%  LMP 03/20/2012  Breastfeeding? No Objective:   Physical Exam  Constitutional: She is oriented to person, place, and time. She appears well-developed and well-nourished. No distress.  HENT:  Head: Normocephalic and atraumatic.  Right Ear: Tympanic membrane, external ear and ear canal normal.  Left Ear: Tympanic membrane, external ear and ear canal normal.  Nose: Rhinorrhea present. No mucosal edema.  Mouth/Throat: Uvula is midline and mucous membranes are normal. Posterior oropharyngeal erythema present. No oropharyngeal exudate, posterior oropharyngeal edema or tonsillar abscesses.  Eyes: Conjunctivae normal are normal. Right eye exhibits no discharge. Left eye exhibits no discharge. No scleral icterus.  Neck: Normal range of motion. Neck supple.  Cardiovascular: Normal rate, regular rhythm, normal heart sounds and intact distal pulses.   Pulmonary/Chest: Effort normal and breath sounds normal.  Lymphadenopathy:       Head (right side): No submandibular, no tonsillar, no preauricular and no posterior auricular adenopathy present.       Head (left side): No submandibular, no tonsillar, no preauricular and no posterior auricular adenopathy present.    She has cervical adenopathy.       Right cervical: Superficial cervical adenopathy present.       Left cervical: Superficial cervical adenopathy present.       Right: Supraclavicular adenopathy present.       Left: No supraclavicular adenopathy present.  Neurological: She is alert and oriented to person, place, and time.  Skin: Skin is warm and dry. She is  not diaphoretic. No erythema.  Psychiatric: She has a normal mood and affect. Her behavior is normal.      Results for orders placed in visit on 04/03/12  POCT INFLUENZA A/B      Component Value Range   Influenza A, POC Negative     Influenza B, POC Negative      Assessment & Plan:  1. URI - Rec  symptomatic care

## 2013-01-11 IMAGING — US US ABDOMEN COMPLETE
1 series · 14 of 25 positions shown · non-contrast
Comparison: None.

CLINICAL DATA: 22 weeks pregnant, right upper quadrant abdominal
pain

ABDOMINAL ULTRASOUND COMPLETE

[Series 1: us abdomen complete · 14 of 86 slices shown]
[im 1/86]
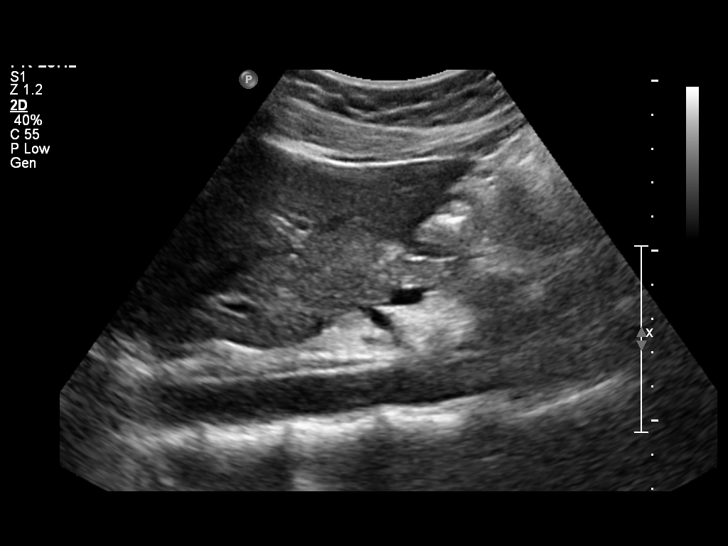
[im 8/86]
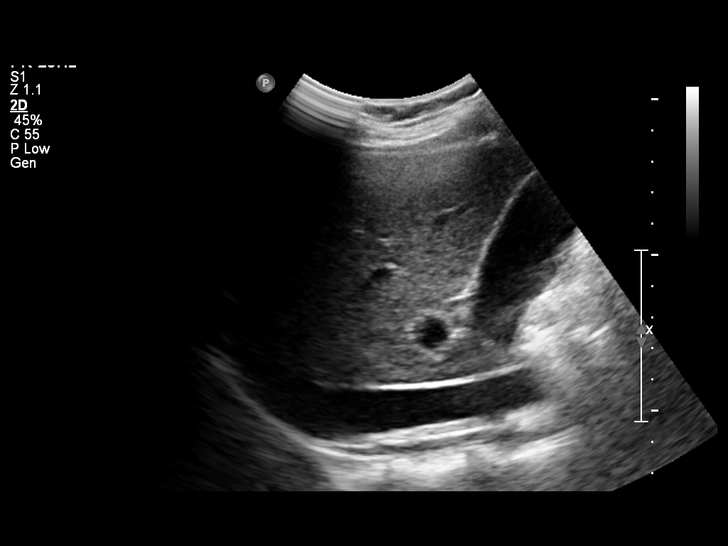
[im 15/86]
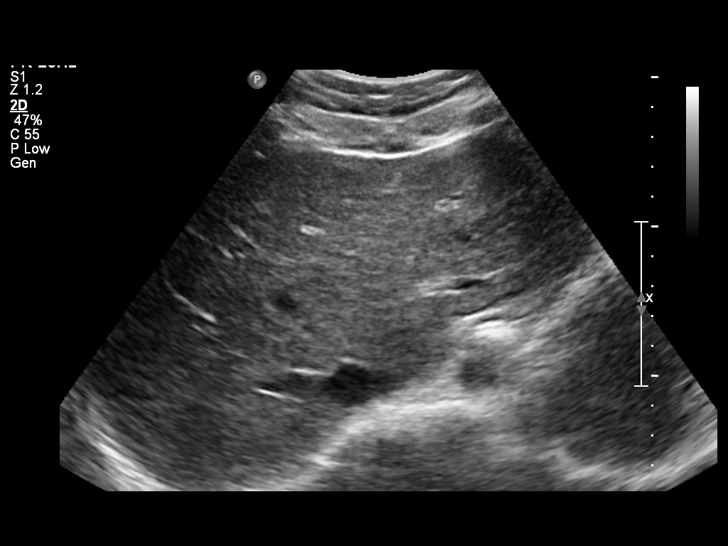
[im 22/86]
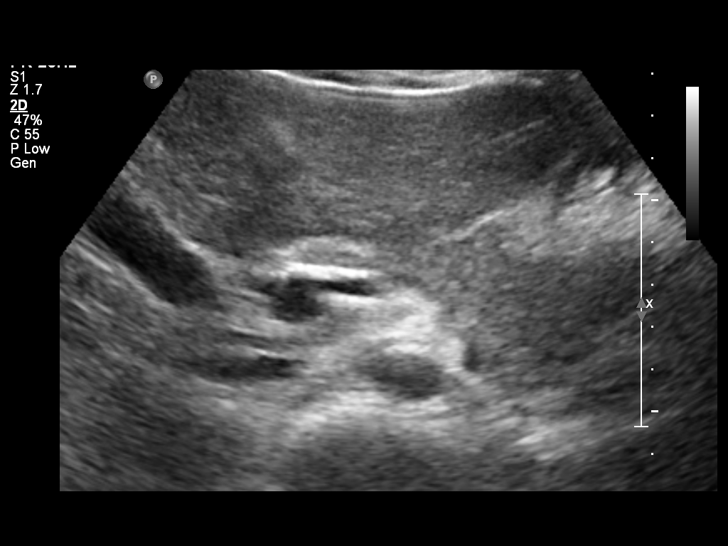
[im 29/86]
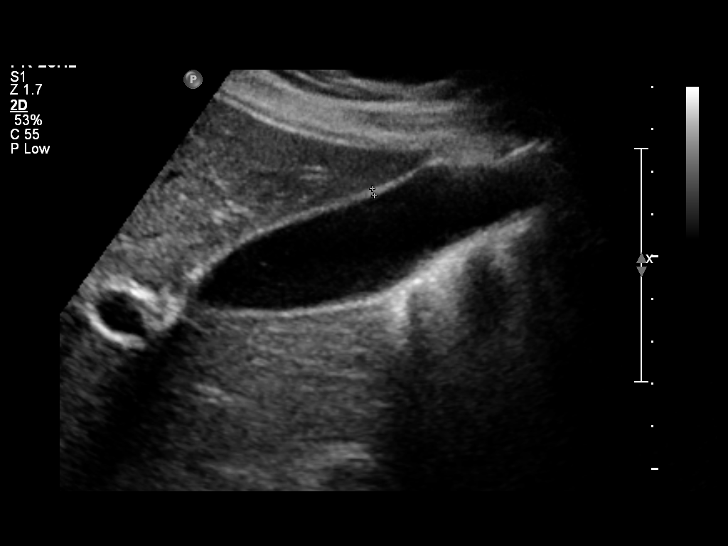
[im 32/86]
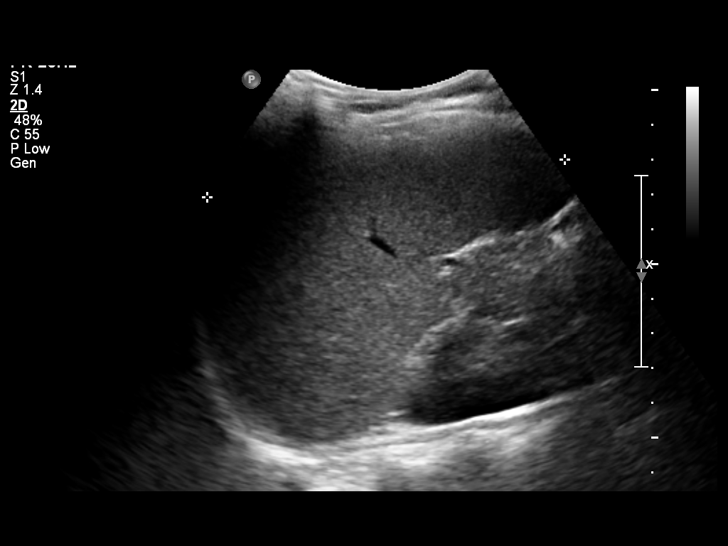
[im 39/86]
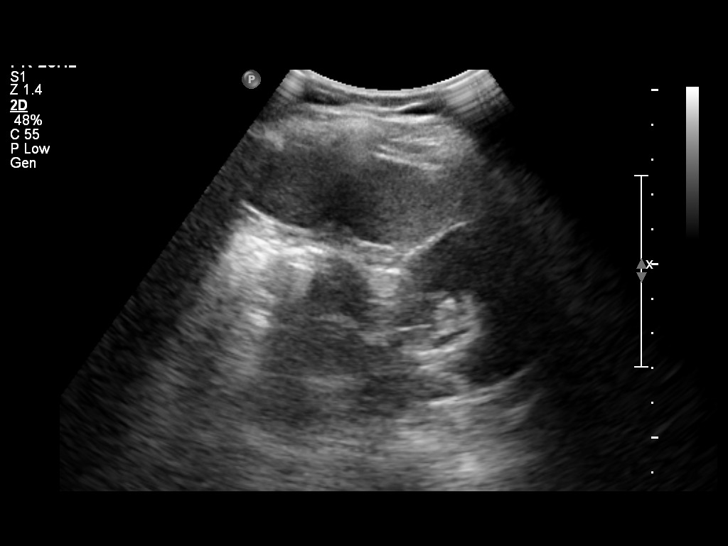
[im 47/86]
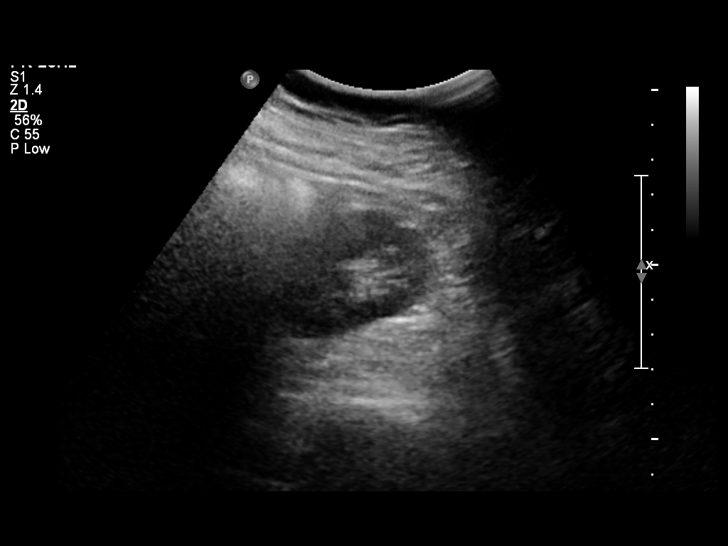
[im 54/86]
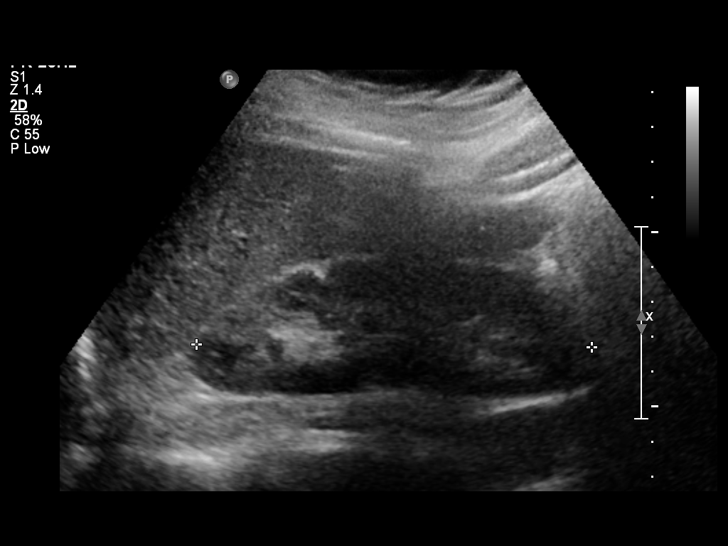
[im 57/86]
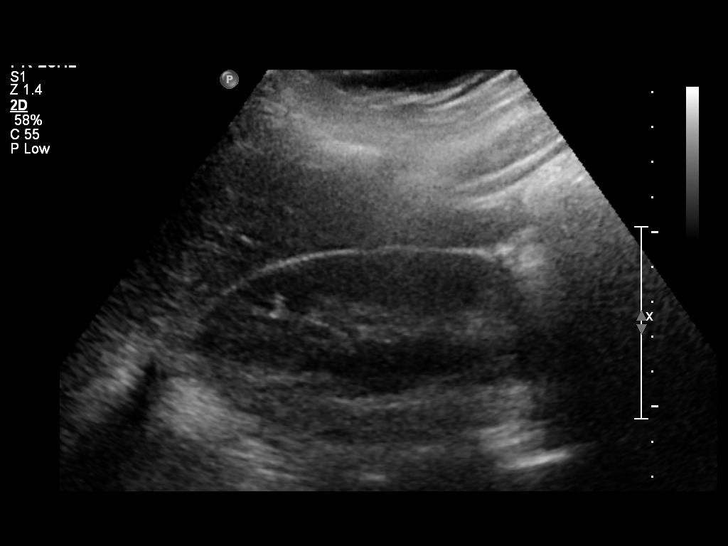
[im 64/86]
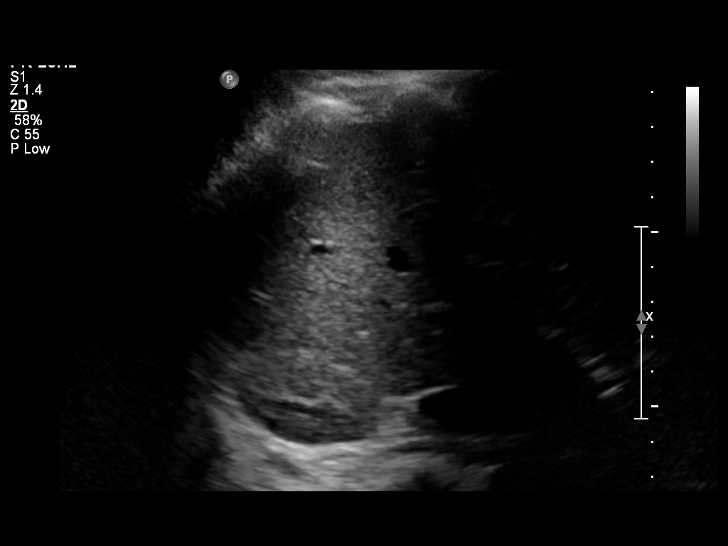
[im 71/86]
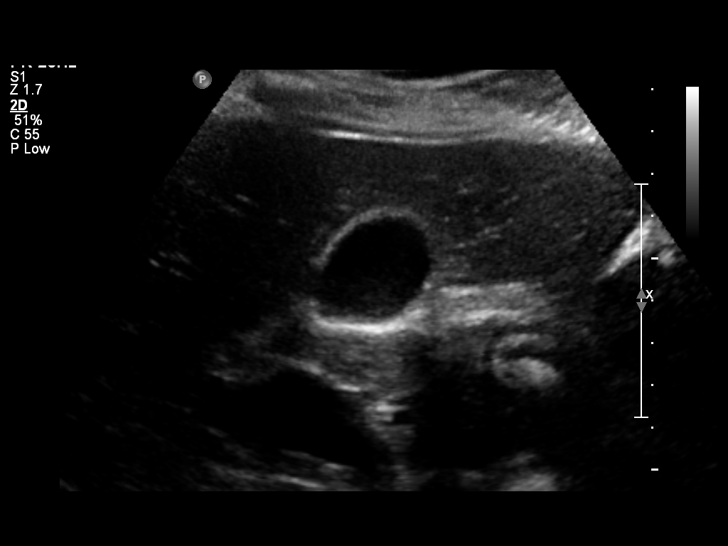
[im 78/86]
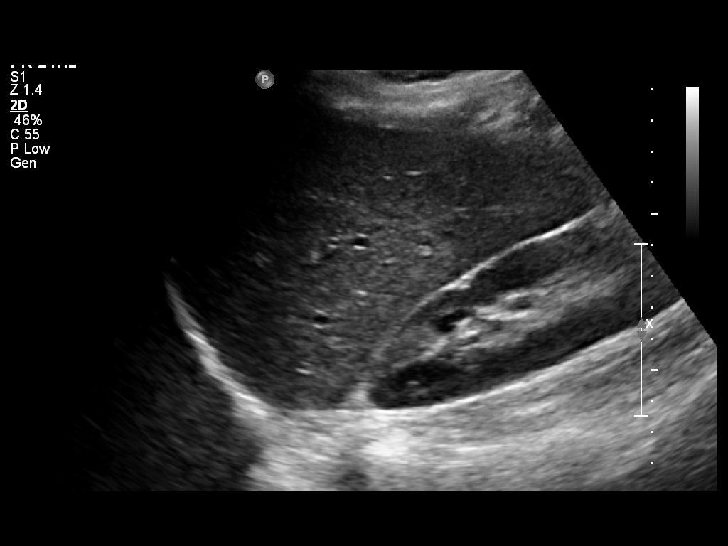
[im 86/86]
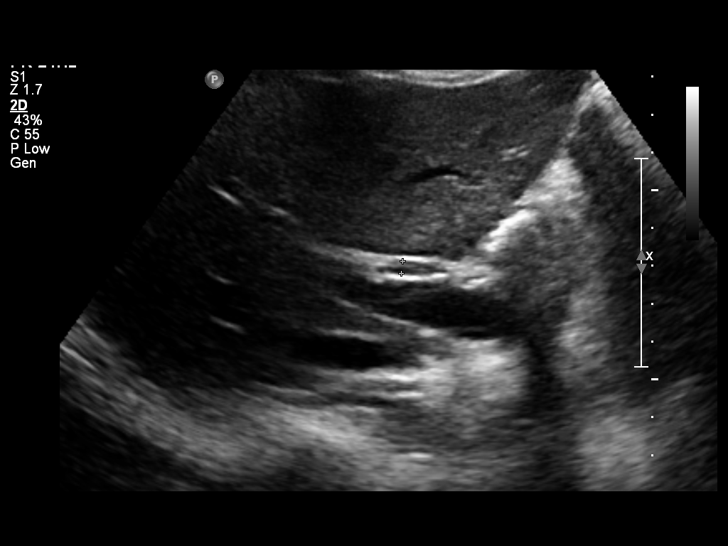

[14 of 25 positions shown; findings below may reference images not displayed]

FINDINGS: Gallbladder:  No gallstones, gallbladder wall thickening, or
pericholecystic fluid.

Common Bile Duct:  Within normal limits in caliber.

Liver: No focal mass lesion identified.  Within normal limits in
parenchymal echogenicity.

IVC:  Appears normal.

Pancreas:  No abnormality identified.

Spleen:  Within normal limits in size and echotexture.

Right kidney:  Normal in size and parenchymal echogenicity.  No
evidence of mass or hydronephrosis.

Left kidney:  Normal in size and parenchymal echogenicity.  No
evidence of mass or hydronephrosis.

Abdominal Aorta:  No aneurysm identified.
IMPRESSION: Negative abdominal ultrasound.

## 2013-03-06 ENCOUNTER — Ambulatory Visit (INDEPENDENT_AMBULATORY_CARE_PROVIDER_SITE_OTHER): Payer: BC Managed Care – PPO | Admitting: Podiatrist

## 2013-03-06 ENCOUNTER — Encounter: Payer: Self-pay | Admitting: Podiatrist

## 2013-03-06 VITALS — BP 136/88 | HR 109 | Resp 18

## 2013-03-06 DIAGNOSIS — L600 Ingrowing nail: Secondary | ICD-10-CM

## 2013-03-06 NOTE — Progress Notes (Signed)
   Subjective:    Patient ID: Christine Garner, female    DOB: May 04, 1985, 27 y.o.   MRN: [id number]  HPI I have an ingrown toenail on the right big toe and been going on for 1 1/2 years and this will be the third time it has been cut on and has never had the chemical put on it and throbs and did one time on the draining    Review of Systems  Constitutional: Negative.   HENT: Negative.   Eyes: Negative.   Respiratory: Negative.   Cardiovascular: Negative.   Gastrointestinal: Negative.   Endocrine: Negative.   Genitourinary: Negative.   Musculoskeletal: Negative.   Skin: Negative.   Allergic/Immunologic: Negative.   Neurological: Negative.   Hematological: Negative.   Psychiatric/Behavioral: Negative.        Objective:   Physical Exam Neurovascular status is intact with palpable and symmetric pedal pulses and neurological sensation intact. Biomechanical examination is also intact with rectus foot type and intrinsic musculature intact. Dermatological examination reveals an ingrown lateral nail border on bilateral hallux nails it is painful and symptomatic with pressure. No redness, no swelling, no signs of infection are present.    Assessment & Plan:  Ingrown lateral nail border bilateral hallux nails Plan:Treatment options and alternatives discussed.  Recommended permanent phenol matrixectomy and patient agreed.  Bilateral halluces were prepped with alcohol and a 1 to 1 mix of 0.5% marcaine plain and 2% lidocaine plain was administered in a digital block fashion.  The toes were then prepped with betadine solution and exsanguinated.  The offending lateral nail border was then excised and matrix tissue exposed.  Phenol was then applied to the matrix tissue followed by an alcohol wash.  Antibiotic ointment and a dry sterile dressing was applied.  The patient was dispensed instructions for aftercare.

## 2013-03-06 NOTE — Patient Instructions (Signed)

## 2013-03-23 ENCOUNTER — Ambulatory Visit: Payer: BC Managed Care – PPO | Admitting: Podiatrist

## 2013-03-27 ENCOUNTER — Encounter: Payer: Self-pay | Admitting: Podiatrist

## 2013-03-27 ENCOUNTER — Ambulatory Visit (INDEPENDENT_AMBULATORY_CARE_PROVIDER_SITE_OTHER): Payer: BC Managed Care – PPO | Admitting: Podiatrist

## 2013-03-27 VITALS — BP 120/81 | HR 98 | Resp 16

## 2013-03-27 DIAGNOSIS — L03039 Cellulitis of unspecified toe: Secondary | ICD-10-CM

## 2013-03-27 MED ORDER — CEPHALEXIN 500 MG PO CAPS: 500.0000 mg | ORAL_CAPSULE | Freq: Four times a day (QID) | ORAL | Status: DC

## 2013-03-29 NOTE — Progress Notes (Signed)
Christine Garner presents today for followup of permanent phenol matrixectomy performed on the right hallux nail on 03/06/2013. She states it still looks red and she wanted to make sure it looks okay. She denies any active pus or pirulence to the toe denies any malodor or active drainage.  Objective: There is some redness and swelling along the proximal nail fold of the right hallux nail. Minimal redness is present possible localized infection is present.  Assessment: Status post permanent phenol matrixectomy right hallux  Plan: Because of the concern for a little bit of redness I did put her on an antibiotic to take. If she continues to have redness and swelling following the completion of the antibiotic she is instructed to call. Otherwise the nail should grow out normally and she should have no more problem with the nail.

## 2013-05-29 ENCOUNTER — Ambulatory Visit (INDEPENDENT_AMBULATORY_CARE_PROVIDER_SITE_OTHER): Payer: BC Managed Care – PPO | Admitting: Podiatrist

## 2013-05-29 ENCOUNTER — Encounter: Payer: Self-pay | Admitting: Podiatrist

## 2013-05-29 VITALS — BP 121/67 | HR 77 | Resp 18

## 2013-05-29 DIAGNOSIS — L03039 Cellulitis of unspecified toe: Secondary | ICD-10-CM

## 2013-05-29 MED ORDER — CEPHALEXIN 500 MG PO CAPS: 500.0000 mg | ORAL_CAPSULE | Freq: Three times a day (TID) | ORAL | Status: DC

## 2013-05-29 NOTE — Progress Notes (Signed)
My right great toenail maybe infected or ingrown again and has been going on for about two weeks and sore and tender and I have been soaking  Christine SetaHeather presents today for followup of permanent phenol matrixectomy performed on the right hallux nail on 03/06/2013. She states it still looks red and it is still sore at the end of the day.  The lateral part of the right hallux nail is the area of concern  Objective: There continues to be some redness and swelling along the proximal nail fold of the right hallux nail. Minimal redness is present with continued localized infection is present.   Assessment: abscess of right great toenail s/p phenol matrixectomy  Plan: Incision and drainage was performed today under local anesthetic without complication.  I also started her back on keflex for 7 more days.  She is to utilize soaks and I dispensed some pointed q tips to keep the corner clear of scabbing and re-infection.

## 2013-05-29 NOTE — Patient Instructions (Signed)
ANTIBACTERIAL SOAP INSTRUCTIONS  THE DAY AFTER PROCEDURE     Shower as usual. Before getting out, place a drop of antibacterial liquid soap (Dial) on a wet, clean washcloth.  Gently wipe washcloth over affected area.  Afterward, rinse the area with warm water.  Blot the area dry with a soft cloth and cover with antibiotic ointment (neosporin, polysporin, bacitracin) and band aid or gauze and tape  OR   Place 3-4 drops of antibacterial liquid soap in a quart of warm tap water.  Submerge foot into water for 20 minutes.  If bandage was applied after your procedure, leave on to allow for easy lift off, then remove and continue with soak for the remaining time.  Next, blot area dry with a soft cloth and cover with a bandage.  Apply other medications as directed by your doctor, such as cortisporin otic solution (eardrops) or neosporin antibiotic ointment  Soak Instructions    THE DAY AFTER THE PROCEDURE  Place 1/4 cup of epsom salts in a quart of warm tap water.  Submerge your foot or feet with outer bandage intact for the initial soak; this will allow the bandage to become moist and wet for easy lift off.  Once you remove your bandage, continue to soak in the solution for 20 minutes.  This soak should be done twice a day.  Next, remove your foot or feet from solution, blot dry the affected area and cover.  You may use a band aid large enough to cover the area or use gauze and tape.  Apply other medications to the area as directed by the doctor such as polysporin neosporin.  IF YOUR SKIN BECOMES IRRITATED WHILE USING THESE INSTRUCTIONS, IT IS OKAY TO SWITCH TO  WHITE VINEGAR AND WATER. Or you may use antibacterial soap and water to keep the toe clean    

## 2014-01-07 ENCOUNTER — Encounter: Payer: Self-pay | Admitting: Podiatrist

## 2014-08-13 ENCOUNTER — Other Ambulatory Visit: Payer: Self-pay | Admitting: Obstetrics & Gynecology

## 2014-08-14 ENCOUNTER — Encounter (HOSPITAL_COMMUNITY): Payer: Self-pay

## 2014-08-14 ENCOUNTER — Encounter (HOSPITAL_COMMUNITY)
Admission: RE | Admit: 2014-08-14 | Discharge: 2014-08-14 | Disposition: A | Discharge status: Home / Self Care | Payer: 59 | Source: Ambulatory Visit | Attending: Obstetrics & Gynecology | Admitting: Obstetrics & Gynecology

## 2014-08-14 LAB — CBC
HCT: 35 % — ABNORMAL LOW (ref 36.0–46.0)
Hemoglobin: 11.8 g/dL — ABNORMAL LOW (ref 12.0–15.0)
MCH: 29.8 pg (ref 26.0–34.0)
MCHC: 33.7 g/dL (ref 30.0–36.0)
MCV: 88.4 fL (ref 78.0–100.0)
PLATELETS: 204 10*3/uL (ref 150–400)
RBC: 3.96 MIL/uL (ref 3.87–5.11)
RDW: 13.8 % (ref 11.5–15.5)
WBC: 8.8 10*3/uL (ref 4.0–10.5)

## 2014-08-14 LAB — TYPE AND SCREEN
ABO/RH(D): A POS
Antibody Screen: NEGATIVE

## 2014-08-14 LAB — ABO/RH: ABO/RH(D): A POS

## 2014-08-14 NOTE — Patient Instructions (Addendum)
Your procedure is scheduled on:  Thursday, August 15, 2014  [facility]:  12:30 PM  Pick up the phone at the desk and dial [phone].  Call this number if you have problems the morning of surgery: [phone].  Remember: Do NOT eat food:  AFTER MIDNIGHT TONIGHT Do NOT drink clear liquids after:  AFTER 10:00 AM TOMORROW Take these medicines the morning of surgery with a SIP OF WATER: NONE  Do NOT wear jewelry (body piercing), metal hair clips/bobby pins, or nail polish. Do NOT wear lotions, powders, or perfumes.  You may wear deoderant. Do NOT shave for 48 hours prior to surgery. Do NOT bring valuables to the hospital. Leave suitcase in car.  After surgery it may be brought to your room.  For patients admitted to the hospital, checkout time is 11:00 AM the day of discharge.

## 2014-08-15 ENCOUNTER — Encounter (HOSPITAL_COMMUNITY): Payer: Self-pay | Admitting: *Deleted

## 2014-08-15 ENCOUNTER — Inpatient Hospital Stay (HOSPITAL_COMMUNITY): Payer: 59 | Admitting: Anesthesiology

## 2014-08-15 ENCOUNTER — Inpatient Hospital Stay (HOSPITAL_COMMUNITY)
Admission: AD | Admit: 2014-08-15 | Discharge: 2014-08-16 | DRG: 765 | Disposition: A | Discharge status: Home / Self Care | Payer: 59 | Source: Ambulatory Visit | Attending: Obstetrics | Admitting: Obstetrics

## 2014-08-15 ENCOUNTER — Encounter (HOSPITAL_COMMUNITY)
Admission: AD | Disposition: A | Discharge status: Home / Self Care | Payer: Self-pay | Source: Ambulatory Visit | Attending: Obstetrics

## 2014-08-15 ENCOUNTER — Encounter (HOSPITAL_COMMUNITY): Payer: Self-pay | Admitting: Anesthesiology

## 2014-08-15 ENCOUNTER — Inpatient Hospital Stay (HOSPITAL_COMMUNITY): Admission: RE | Admit: 2014-08-15 | Payer: 59 | Source: Ambulatory Visit | Admitting: Obstetrics & Gynecology

## 2014-08-15 DIAGNOSIS — Z3A40 40 weeks gestation of pregnancy: Secondary | ICD-10-CM | POA: Diagnosis present

## 2014-08-15 DIAGNOSIS — Z8249 Family history of ischemic heart disease and other diseases of the circulatory system: Secondary | ICD-10-CM

## 2014-08-15 DIAGNOSIS — K668 Other specified disorders of peritoneum: Secondary | ICD-10-CM | POA: Diagnosis present

## 2014-08-15 DIAGNOSIS — O403XX0 Polyhydramnios, third trimester, not applicable or unspecified: Secondary | ICD-10-CM | POA: Diagnosis present

## 2014-08-15 DIAGNOSIS — O3421 Maternal care for scar from previous cesarean delivery: Principal | ICD-10-CM | POA: Diagnosis present

## 2014-08-15 DIAGNOSIS — Z98891 History of uterine scar from previous surgery: Secondary | ICD-10-CM

## 2014-08-15 LAB — RPR: RPR: NONREACTIVE

## 2014-08-15 SURGERY — Surgical Case
Anesthesia: Spinal | Site: Abdomen

## 2014-08-15 SURGERY — Surgical Case
Anesthesia: Spinal

## 2014-08-15 MED ORDER — NALBUPHINE HCL 10 MG/ML IJ SOLN: 5.0000 mg | Freq: Once | INTRAMUSCULAR | Status: AC | PRN

## 2014-08-15 MED ORDER — DIPHENHYDRAMINE HCL 25 MG PO CAPS: 25.0000 mg | ORAL_CAPSULE | Freq: Four times a day (QID) | ORAL | Status: DC | PRN

## 2014-08-15 MED ORDER — LACTATED RINGERS IV BOLUS (SEPSIS)
1000.0000 mL | Freq: Once | INTRAVENOUS | Status: AC
  Administered 2014-08-15: 1000 mL via INTRAVENOUS

## 2014-08-15 MED ORDER — DIBUCAINE 1 % RE OINT: 1.0000 "application " | TOPICAL_OINTMENT | RECTAL | Status: DC | PRN

## 2014-08-15 MED ORDER — SODIUM CHLORIDE 0.9 % IJ SOLN: 3.0000 mL | INTRAMUSCULAR | Status: DC | PRN

## 2014-08-15 MED ORDER — NALBUPHINE HCL 10 MG/ML IJ SOLN
5.0000 mg | INTRAMUSCULAR | Status: DC | PRN
  Administered 2014-08-15: 5 mg via SUBCUTANEOUS

## 2014-08-15 MED ORDER — 0.9 % SODIUM CHLORIDE (POUR BTL) OPTIME
TOPICAL | Status: DC | PRN
  Administered 2014-08-15: 1000 mL

## 2014-08-15 MED ORDER — LACTATED RINGERS IV SOLN
INTRAVENOUS | Status: DC
  Administered 2014-08-15: 22:00:00 via INTRAVENOUS

## 2014-08-15 MED ORDER — NALOXONE HCL 0.4 MG/ML IJ SOLN: 0.4000 mg | INTRAMUSCULAR | Status: DC | PRN

## 2014-08-15 MED ORDER — DEXTROSE 5 % IV SOLN
1.0000 ug/kg/h | INTRAVENOUS | Status: DC | PRN
  Filled 2014-08-15: qty 2

## 2014-08-15 MED ORDER — MORPHINE SULFATE (PF) 0.5 MG/ML IJ SOLN
INTRAMUSCULAR | Status: DC | PRN
  Administered 2014-08-15: .1 mg via INTRATHECAL

## 2014-08-15 MED ORDER — OXYTOCIN 40 UNITS IN LACTATED RINGERS INFUSION - SIMPLE MED: 62.5000 mL/h | INTRAVENOUS | Status: DC

## 2014-08-15 MED ORDER — SENNOSIDES-DOCUSATE SODIUM 8.6-50 MG PO TABS
2.0000 | ORAL_TABLET | ORAL | Status: DC
  Administered 2014-08-15: 2 via ORAL
  Filled 2014-08-15: qty 2

## 2014-08-15 MED ORDER — FENTANYL CITRATE (PF) 100 MCG/2ML IJ SOLN
INTRAMUSCULAR | Status: DC | PRN
  Administered 2014-08-15: 25 ug via INTRATHECAL

## 2014-08-15 MED ORDER — FENTANYL CITRATE (PF) 100 MCG/2ML IJ SOLN
INTRAMUSCULAR | Status: AC
  Filled 2014-08-15: qty 2

## 2014-08-15 MED ORDER — SIMETHICONE 80 MG PO CHEW
80.0000 mg | CHEWABLE_TABLET | Freq: Three times a day (TID) | ORAL | Status: DC
  Administered 2014-08-15 – 2014-08-16 (×3): 80 mg via ORAL
  Filled 2014-08-15 (×3): qty 1

## 2014-08-15 MED ORDER — DIPHENHYDRAMINE HCL 50 MG/ML IJ SOLN: 12.5000 mg | INTRAMUSCULAR | Status: DC | PRN

## 2014-08-15 MED ORDER — SIMETHICONE 80 MG PO CHEW
80.0000 mg | CHEWABLE_TABLET | ORAL | Status: DC
  Administered 2014-08-15: 80 mg via ORAL
  Filled 2014-08-15: qty 1

## 2014-08-15 MED ORDER — PROMETHAZINE HCL 25 MG/ML IJ SOLN: 6.2500 mg | INTRAMUSCULAR | Status: DC | PRN

## 2014-08-15 MED ORDER — NALBUPHINE HCL 10 MG/ML IJ SOLN
INTRAMUSCULAR | Status: AC
  Administered 2014-08-15: 5 mg via SUBCUTANEOUS
  Filled 2014-08-15: qty 1

## 2014-08-15 MED ORDER — OXYTOCIN 10 UNIT/ML IJ SOLN
INTRAMUSCULAR | Status: AC
  Filled 2014-08-15: qty 1

## 2014-08-15 MED ORDER — MENTHOL 3 MG MT LOZG: 1.0000 | LOZENGE | OROMUCOSAL | Status: DC | PRN

## 2014-08-15 MED ORDER — SCOPOLAMINE 1 MG/3DAYS TD PT72
1.0000 | MEDICATED_PATCH | Freq: Once | TRANSDERMAL | Status: DC
  Administered 2014-08-15: 1.5 mg via TRANSDERMAL
  Filled 2014-08-15: qty 1

## 2014-08-15 MED ORDER — KETOROLAC TROMETHAMINE 30 MG/ML IJ SOLN
INTRAMUSCULAR | Status: AC
  Filled 2014-08-15: qty 1

## 2014-08-15 MED ORDER — TETANUS-DIPHTH-ACELL PERTUSSIS 5-2.5-18.5 LF-MCG/0.5 IM SUSP: 0.5000 mL | Freq: Once | INTRAMUSCULAR | Status: DC

## 2014-08-15 MED ORDER — DIPHENHYDRAMINE HCL 25 MG PO CAPS: 25.0000 mg | ORAL_CAPSULE | ORAL | Status: DC | PRN

## 2014-08-15 MED ORDER — ZOLPIDEM TARTRATE 5 MG PO TABS: 5.0000 mg | ORAL_TABLET | Freq: Every evening | ORAL | Status: DC | PRN

## 2014-08-15 MED ORDER — ONDANSETRON HCL 4 MG/2ML IJ SOLN
INTRAMUSCULAR | Status: AC
  Filled 2014-08-15: qty 2

## 2014-08-15 MED ORDER — SCOPOLAMINE 1 MG/3DAYS TD PT72
MEDICATED_PATCH | TRANSDERMAL | Status: AC
Start: 2014-08-15 — End: 2014-08-15
  Filled 2014-08-15: qty 1

## 2014-08-15 MED ORDER — MEPERIDINE HCL 25 MG/ML IJ SOLN: 6.2500 mg | INTRAMUSCULAR | Status: DC | PRN

## 2014-08-15 MED ORDER — KETOROLAC TROMETHAMINE 30 MG/ML IJ SOLN: 30.0000 mg | Freq: Four times a day (QID) | INTRAMUSCULAR | Status: DC | PRN

## 2014-08-15 MED ORDER — LACTATED RINGERS IV SOLN
INTRAVENOUS | Status: DC | PRN
  Administered 2014-08-15 (×2): via INTRAVENOUS

## 2014-08-15 MED ORDER — WITCH HAZEL-GLYCERIN EX PADS: 1.0000 "application " | MEDICATED_PAD | CUTANEOUS | Status: DC | PRN

## 2014-08-15 MED ORDER — PHENYLEPHRINE 8 MG IN D5W 100 ML (0.08MG/ML) PREMIX OPTIME
INJECTION | INTRAVENOUS | Status: DC | PRN
  Administered 2014-08-15: 60 ug/min via INTRAVENOUS

## 2014-08-15 MED ORDER — IBUPROFEN 600 MG PO TABS
600.0000 mg | ORAL_TABLET | Freq: Four times a day (QID) | ORAL | Status: DC
  Administered 2014-08-15 – 2014-08-16 (×5): 600 mg via ORAL
  Filled 2014-08-15 (×5): qty 1

## 2014-08-15 MED ORDER — LANOLIN HYDROUS EX OINT: 1.0000 "application " | TOPICAL_OINTMENT | CUTANEOUS | Status: DC | PRN

## 2014-08-15 MED ORDER — LACTATED RINGERS IV SOLN
Freq: Once | INTRAVENOUS | Status: AC
  Administered 2014-08-15: 11:00:00 via INTRAVENOUS

## 2014-08-15 MED ORDER — IBUPROFEN 600 MG PO TABS: 600.0000 mg | ORAL_TABLET | Freq: Four times a day (QID) | ORAL | Status: DC | PRN

## 2014-08-15 MED ORDER — ONDANSETRON HCL 4 MG/2ML IJ SOLN
INTRAMUSCULAR | Status: DC | PRN
  Administered 2014-08-15: 4 mg via INTRAVENOUS

## 2014-08-15 MED ORDER — FAMOTIDINE IN NACL 20-0.9 MG/50ML-% IV SOLN
20.0000 mg | Freq: Once | INTRAVENOUS | Status: AC
Start: 2014-08-15 — End: 2014-08-15
  Administered 2014-08-15: 20 mg via INTRAVENOUS
  Filled 2014-08-15: qty 50

## 2014-08-15 MED ORDER — OXYCODONE-ACETAMINOPHEN 5-325 MG PO TABS: 1.0000 | ORAL_TABLET | ORAL | Status: DC | PRN

## 2014-08-15 MED ORDER — SIMETHICONE 80 MG PO CHEW: 80.0000 mg | CHEWABLE_TABLET | ORAL | Status: DC | PRN

## 2014-08-15 MED ORDER — SCOPOLAMINE 1 MG/3DAYS TD PT72
1.0000 | MEDICATED_PATCH | Freq: Once | TRANSDERMAL | Status: DC
  Filled 2014-08-15: qty 1

## 2014-08-15 MED ORDER — PRENATAL MULTIVITAMIN CH
1.0000 | ORAL_TABLET | Freq: Every day | ORAL | Status: DC
  Administered 2014-08-16: 1 via ORAL
  Filled 2014-08-15: qty 1

## 2014-08-15 MED ORDER — MEPERIDINE HCL 25 MG/ML IJ SOLN
6.2500 mg | INTRAMUSCULAR | Status: DC | PRN
  Administered 2014-08-15: 12.5 mg via INTRAVENOUS

## 2014-08-15 MED ORDER — MEPERIDINE HCL 25 MG/ML IJ SOLN
INTRAMUSCULAR | Status: AC
  Administered 2014-08-15: 12.5 mg via INTRAVENOUS
  Filled 2014-08-15: qty 1

## 2014-08-15 MED ORDER — LACTATED RINGERS IV SOLN
40.0000 [IU] | INTRAVENOUS | Status: DC | PRN
  Administered 2014-08-15: 40 [IU] via INTRAVENOUS

## 2014-08-15 MED ORDER — ACETAMINOPHEN 325 MG PO TABS: 650.0000 mg | ORAL_TABLET | ORAL | Status: DC | PRN

## 2014-08-15 MED ORDER — OXYCODONE-ACETAMINOPHEN 5-325 MG PO TABS: 2.0000 | ORAL_TABLET | ORAL | Status: DC | PRN

## 2014-08-15 MED ORDER — MORPHINE SULFATE 0.5 MG/ML IJ SOLN
INTRAMUSCULAR | Status: AC
  Filled 2014-08-15: qty 10

## 2014-08-15 MED ORDER — LACTATED RINGERS IV SOLN: INTRAVENOUS | Status: DC

## 2014-08-15 MED ORDER — PHENYLEPHRINE HCL 10 MG/ML IJ SOLN
INTRAMUSCULAR | Status: DC | PRN
  Administered 2014-08-15: 40 ug via INTRAVENOUS
  Administered 2014-08-15: 120 ug via INTRAVENOUS
  Administered 2014-08-15: 80 ug via INTRAVENOUS
  Administered 2014-08-15: 40 ug via INTRAVENOUS

## 2014-08-15 MED ORDER — CEFAZOLIN SODIUM-DEXTROSE 2-3 GM-% IV SOLR
2.0000 g | INTRAVENOUS | Status: AC
  Administered 2014-08-15: 2 g via INTRAVENOUS
  Filled 2014-08-15: qty 50

## 2014-08-15 MED ORDER — CITRIC ACID-SODIUM CITRATE 334-500 MG/5ML PO SOLN
30.0000 mL | Freq: Once | ORAL | Status: AC
  Administered 2014-08-15: 30 mL via ORAL
  Filled 2014-08-15: qty 15

## 2014-08-15 MED ORDER — NALBUPHINE HCL 10 MG/ML IJ SOLN: 5.0000 mg | INTRAMUSCULAR | Status: DC | PRN

## 2014-08-15 MED ORDER — HYDROMORPHONE HCL 1 MG/ML IJ SOLN: 0.2500 mg | INTRAMUSCULAR | Status: DC | PRN

## 2014-08-15 MED ORDER — KETOROLAC TROMETHAMINE 30 MG/ML IJ SOLN
30.0000 mg | Freq: Four times a day (QID) | INTRAMUSCULAR | Status: DC | PRN
  Administered 2014-08-15: 30 mg via INTRAMUSCULAR

## 2014-08-15 MED ORDER — ONDANSETRON HCL 4 MG/2ML IJ SOLN: 4.0000 mg | Freq: Three times a day (TID) | INTRAMUSCULAR | Status: DC | PRN

## 2014-08-15 MED ORDER — KETOROLAC TROMETHAMINE 30 MG/ML IJ SOLN: 30.0000 mg | Freq: Once | INTRAMUSCULAR | Status: DC | PRN

## 2014-08-15 SURGICAL SUPPLY — 23 items
CLAMP CORD UMBIL (MISCELLANEOUS) ×2 IMPLANT
CLOTH BEACON ORANGE TIMEOUT ST (SAFETY) ×2 IMPLANT
DRAPE SHEET LG 3/4 BI-LAMINATE (DRAPES) ×2 IMPLANT
DRSG OPSITE POSTOP 4X10 (GAUZE/BANDAGES/DRESSINGS) ×2 IMPLANT
DURAPREP 26ML APPLICATOR (WOUND CARE) ×2 IMPLANT
ELECT REM PT RETURN 9FT ADLT (ELECTROSURGICAL) ×2
ELECTRODE REM PT RTRN 9FT ADLT (ELECTROSURGICAL) ×1 IMPLANT
GLOVE BIO SURGEON STRL SZ 6.5 (GLOVE) ×2 IMPLANT
GLOVE BIOGEL PI IND STRL 7.0 (GLOVE) ×1 IMPLANT
GLOVE BIOGEL PI INDICATOR 7.0 (GLOVE) ×1
GOWN STRL REUS W/TWL LRG LVL3 (GOWN DISPOSABLE) ×4 IMPLANT
NS IRRIG 1000ML POUR BTL (IV SOLUTION) ×2 IMPLANT
PACK C SECTION WH (CUSTOM PROCEDURE TRAY) ×2 IMPLANT
PAD OB MATERNITY 4.3X12.25 (PERSONAL CARE ITEMS) ×2 IMPLANT
STRIP CLOSURE SKIN 1/2X4 (GAUZE/BANDAGES/DRESSINGS) IMPLANT
SUT MON AB 4-0 PS1 27 (SUTURE) ×2 IMPLANT
SUT VIC AB 0 CT1 36 (SUTURE) ×6 IMPLANT
SUT VIC AB 0 CTX 36 (SUTURE) ×2
SUT VIC AB 0 CTX36XBRD ANBCTRL (SUTURE) ×2 IMPLANT
SUT VIC AB 2-0 CT1 27 (SUTURE) ×1
SUT VIC AB 2-0 CT1 TAPERPNT 27 (SUTURE) ×1 IMPLANT
TOWEL OR 17X24 6PK STRL BLUE (TOWEL DISPOSABLE) ×2 IMPLANT
TRAY FOLEY CATH SILVER 14FR (SET/KITS/TRAYS/PACK) ×2 IMPLANT

## 2014-08-15 NOTE — Anesthesia Postprocedure Evaluation (Signed)
  Anesthesia Post-op Note  Patient: Christine Garner  Procedure(s) Performed: Procedure(s): CESAREAN SECTION (N/A)  Patient is awake, responsive, moving her legs, and has signs of resolution of her numbness. Pain and nausea are reasonably well controlled. Vital signs are stable and clinically acceptable. Oxygen saturation is clinically acceptable. There are no apparent anesthetic complications at this time. Patient is ready for discharge.

## 2014-08-15 NOTE — Transfer of Care (Signed)
Immediate Anesthesia Transfer of Care Note  Patient: Christine Garner  Procedure(s) Performed: Procedure(s): CESAREAN SECTION (N/A)  Patient Location: PACU  Anesthesia Type:Spinal  Level of Consciousness: awake  Airway & Oxygen Therapy: Patient Spontanous Breathing  Post-op Assessment: Report given to RN  Post vital signs: Reviewed and stable  Last Vitals:  Filed Vitals:   08/15/14 0906  BP: 107/68  Pulse: 80  Temp: 36.6 C  Resp: 20    Complications: No apparent anesthesia complications

## 2014-08-15 NOTE — Op Note (Signed)
08/15/2014  11:20 AM  PATIENT:  Christine Garner  29 y.o. female  PRE-OPERATIVE DIAGNOSIS:  Previous cesarean section / polyhydramnios  POST-OPERATIVE DIAGNOSIS:  Previous cesarean section / polyhydramnios, omental lesion  PROCEDURE:  Procedure(s): CESAREAN SECTION (N/A)  RCS with 2 layer closure Excision of omental lesion  SURGEON:  Surgeon(s) and Role:    * Noland Fordyce, MD - Primary  PHYSICIAN ASSISTANT:   ASSISTANTSArita Miss, CNM   ANESTHESIA:   spinal  EBL:  Total I/O In: 2300 [I.V.:2300] Out: 750 [Urine:50; Blood:700]  BLOOD ADMINISTERED:none  DRAINS: Urinary Catheter (Foley)   LOCAL MEDICATIONS USED:  NONE  SPECIMEN:  Source of Specimen:  placenta to L&D, omental lesion to pathology  DISPOSITION OF SPECIMEN:  PATHOLOGY  COUNTS:  YES  TOURNIQUET:  * No tourniquets in log *  DICTATION: .Dragon Dictation  PLAN OF CARE: Admit to inpatient   PATIENT DISPOSITION:  PACU - hemodynamically stable.   Delay start of Pharmacological VTE agent (>24hrs) due to surgical blood loss or risk of bleeding: yes   Findings:  @BABYSEXEBC @ infant,  APGAR (1 MIN): 9   APGAR (5 MINS): 9   APGAR (10 MINS):   Normal uterus, tubes and ovaries, normal placenta. 3VC, clear amniotic fluid Small quarter sized lesion on omentum  EBL: per anasthesia cc Antibiotics:  2g Ancef Complications: none  Indications: This is a 29 y.o. year-old, G2P1001  At [redacted]w[redacted]d admitted for active labor. Pt w/ prior c/s and planned RCS later today, presented in active labor with cvx 5/100%/ bulging bag.  Risks benefits and alternatives of the procedure were discussed with the patient who agreed to proceed  Procedure:  After informed consent was obtained the patient was taken to the operating room where spinal anesthesia was initiated.  She was prepped and draped in the normal sterile fashion in dorsal supine position with a leftward tilt.  A foley catheter was in place.  A Pfannenstiel skin incision was made  2 cm above the pubic symphysis in the midline with the scalpel.  Dissection was carried down with the Bovie cautery until the fascia was reached. The fascia was incised in the midline. The incision was extended laterally with the Mayo scissors. The inferior aspect of the fascial incision was grasped with the Coker clamps, elevated up and the underlying rectus muscles were dissected off sharply. The superior aspect of the fascial incision was grasped with the Coker clamps elevated up and the underlying rectus muscles were dissected off sharply.  The peritoneum was entered sharply. The peritoneal incision was extended superiorly and inferiorly with good visualization of the bladder. The bladder blade was inserted and palpation was done to assess the fetal position and the location of the uterine vessels. The bl;adder flap was retracted inferiolry sharply. The lower segment of the uterus was incised sharply with the scalpel and extended  bluntly in the cephalo-caudal fashion. The infant was grasped, brought to the incision,  rotated and the infant was delivered with fundal pressure after nuchal cord x 1 was reduced. The nose and mouth were bulb suctioned. The cord was clamped and cut. The infant was handed off to the waiting pediatrician. The placenta was expressed. The uterus was exteriorized. The uterus was cleared of all clots and debris. The uterine incision was repaired with 0 Vicryl in a running locked fashion.  A second layer of the same suture was used in an imbricating fashion to obtain excellent hemostasis.  The uterus was then returned to the abdomen, the  gutters were cleared of all clots and debris. The uterine incision was reinspected and found to be hemostatic. A small flat, omental lesion, quarter sized was noted. Kelley clamp was placed along the attachement to the normal omentum and elevated away from the bowel. Cautery was used to remove the lesion and a free tie was placed for further hemostasis.  Further exploration was done and no additional lesions were noted. The peritoneum was grasped and closed with 2-0 Vicryl in a running fashion. The cut muscle edges and the underside of the fascia were inspected and found to be hemostatic. The fascia was closed with 0 Vicryl in two halves . The subcutaneous tissue was irrigated. Scarpa's layer was closed with a 2-0 plain gut suture. The skin was closed with a 4-0 Monocryl in a single layer. The patient tolerated the procedure well. Sponge lap and needle counts were correct x3 and patient was taken to the recovery room in a stable condition.  Kenyada Hy A. 08/15/2014 11:22 AM

## 2014-08-15 NOTE — MAU Note (Signed)
Dr. Algie Coffer discussing repeat C/S vs VBAC with pt & her husband.

## 2014-08-15 NOTE — Anesthesia Preprocedure Evaluation (Signed)
Anesthesia Evaluation  Patient identified by MRN, date of birth, ID band Patient awake    Reviewed: Allergy & Precautions, H&P , Patient's Chart, lab work & pertinent test results  Airway Mallampati: II TM Distance: >3 FB Neck ROM: full    Dental no notable dental hx.    Pulmonary  breath sounds clear to auscultation  Pulmonary exam normal       Cardiovascular Exercise Tolerance: Good Rhythm:regular Rate:Normal     Neuro/Psych    GI/Hepatic   Endo/Other    Renal/GU      Musculoskeletal   Abdominal   Peds  Hematology   Anesthesia Other Findings   Reproductive/Obstetrics                           Anesthesia Physical Anesthesia Plan  ASA: II and emergent  Anesthesia Plan: Spinal   Post-op Pain Management:    Induction:   Airway Management Planned:   Additional Equipment:   Intra-op Plan:   Post-operative Plan:   Informed Consent: I have reviewed the patients History and Physical, chart, labs and discussed the procedure including the risks, benefits and alternatives for the proposed anesthesia with the patient or authorized representative who has indicated his/her understanding and acceptance.   Dental Advisory Given  Plan Discussed with: CRNA  Anesthesia Plan Comments: (Lab work confirmed with CRNA in room. Platelets okay. Discussed spinal anesthetic, and patient consents to the procedure:  included risk of possible headache,backache, failed block, allergic reaction, and nerve injury. This patient was asked if she had any questions or concerns before the procedure started. )        Anesthesia Quick Evaluation  

## 2014-08-15 NOTE — Brief Op Note (Signed)
08/15/2014  11:20 AM  PATIENT:  Christine Garner  29 y.o. female  PRE-OPERATIVE DIAGNOSIS:  Previous cesarean section / polyhydramnios  POST-OPERATIVE DIAGNOSIS:  Previous cesarean section / polyhydramnios, omental lesion  PROCEDURE:  Procedure(s): CESAREAN SECTION (N/A)  RCS with 2 layer closure Excision of omental lesion  SURGEON:  Surgeon(s) and Role:    * Noland Fordyce, MD - Primary  PHYSICIAN ASSISTANT:   ASSISTANTSArita Miss, CNM   ANESTHESIA:   spinal  EBL:  Total I/O In: 2300 [I.V.:2300] Out: 750 [Urine:50; Blood:700]  BLOOD ADMINISTERED:none  DRAINS: Urinary Catheter (Foley)   LOCAL MEDICATIONS USED:  NONE  SPECIMEN:  Source of Specimen:  placenta to L&D, omental lesion to pathology  DISPOSITION OF SPECIMEN:  PATHOLOGY  COUNTS:  YES  TOURNIQUET:  * No tourniquets in log *  DICTATION: .Dragon Dictation  PLAN OF CARE: Admit to inpatient   PATIENT DISPOSITION:  PACU - hemodynamically stable.   Delay start of Pharmacological VTE agent (>24hrs) due to surgical blood loss or risk of bleeding: yes

## 2014-08-15 NOTE — Anesthesia Procedure Notes (Signed)
Spinal Patient location during procedure: OR Preanesthetic Checklist Completed: patient identified, site marked, surgical consent, pre-op evaluation, timeout performed, IV checked, risks and benefits discussed and monitors and equipment checked Spinal Block Patient position: sitting Prep: DuraPrep Patient monitoring: heart rate, cardiac monitor, continuous pulse ox and blood pressure Approach: midline Location: L3-4 Injection technique: single-shot Needle Needle type: Sprotte  Needle gauge: 24 G Needle length: 9 cm Assessment Sensory level: T4 Additional Notes Spinal Dosage in OR  Bupivicaine ml       1.2 PFMS04   mcg        100 Fentanyl mcg            25    

## 2014-08-15 NOTE — MAU Note (Signed)
Contractions started around 0100, have been 5-7 minutes apart since 0600.  Denies bleeding or LOF.

## 2014-08-15 NOTE — H&P (Signed)
Christine Garner is a 29 y.o. G2P1001 at 110w1d presenting for active labor. Pt notes onset contractions 11p, getting stronger . Good fetal movement, No vaginal bleeding, no leaking fluid.  [facility] at [facility] since 1st trimester -h/o PCS for fetal intol to early labor, plannign RCS - polyhydramnios, EFW 8#   Prenatal Transfer Tool  Maternal Diabetes: No Genetic Screening: Normal Maternal Ultrasounds/Referrals: Normal Fetal Ultrasounds or other Referrals:  None Maternal Substance Abuse:  No Significant Maternal Medications:  None Significant Maternal Lab Results: None     OB History    Gravida Para Term Preterm AB TAB SAB Ectopic Multiple Living   2 1 1  0 0 0 0 0 0 1     Past Medical History  Diagnosis Date  . Elevated liver enzymes 2012    DURING FIRST PREGNANCY, RESOLVED   Past Surgical History  Procedure Laterality Date  . Wisdom tooth extraction  2008  . Cesarean section  06/15/2011    Procedure: CESAREAN SECTION;  Surgeon: Robley Fries, MD;  Location: [facility];  Service: Gynecology;  Laterality: N/A;  . Gum graft     Family History: family history includes Cancer in her mother; Heart failure in her maternal grandmother. Social History:  reports that she has never smoked. She has never used smokeless tobacco. She reports that she does not drink alcohol or use illicit drugs.  Review of Systems - Negative except painful contractions,     Blood pressure 107/68, pulse 80, temperature 97.9 F (36.6 C), temperature source Oral, resp. rate 20.  Physical Exam:  Gen: well appearing, no distress  Back: no CVAT Abd: gravid, NT, no RUQ pain LE: no edema, equal bilaterally, non-tender Toco: q 2-3 FH: baseline 140s, accelerations present, no deceleratons, 10 beat variability  Prenatal labs: ABO, Rh: --/--/A POS, A POS (06/08 1210) Antibody: NEG (06/08 1210) Rubella:  immune RPR: Non Reactive (06/08 1210)  HBsAg:   neg HIV:   neg GBS:   neg 1 hr Glucola  86  Genetic screening nl AFP, nl NT Anatomy US nl other than EIF   Assessment/Plan: 29 y.o. G2P1001 at [redacted]w[redacted]d Active labor, decline trial of VBAC. Move to RCS, R/B d/w pt   Corben Auzenne A. 08/15/2014, 9:49 AM

## 2014-08-16 LAB — CBC
HEMATOCRIT: 30.1 % — AB (ref 36.0–46.0)
HEMOGLOBIN: 10 g/dL — AB (ref 12.0–15.0)
MCH: 29.6 pg (ref 26.0–34.0)
MCHC: 33.2 g/dL (ref 30.0–36.0)
MCV: 89.1 fL (ref 78.0–100.0)
Platelets: 166 10*3/uL (ref 150–400)
RBC: 3.38 MIL/uL — AB (ref 3.87–5.11)
RDW: 14.2 % (ref 11.5–15.5)
WBC: 12.8 10*3/uL — AB (ref 4.0–10.5)

## 2014-08-16 LAB — BIRTH TISSUE RECOVERY COLLECTION (PLACENTA DONATION)

## 2014-08-16 MED ORDER — IBUPROFEN 600 MG PO TABS: 600.0000 mg | ORAL_TABLET | Freq: Four times a day (QID) | ORAL | Status: DC

## 2014-08-16 MED ORDER — OXYCODONE-ACETAMINOPHEN 5-325 MG PO TABS: 1.0000 | ORAL_TABLET | ORAL | Status: DC | PRN

## 2014-08-16 NOTE — Discharge Summary (Signed)
POSTOPERATIVE DISCHARGE SUMMARY:  Patient ID: Christine Garner MRN: [id number] DOB/AGE: 1985/09/13 29 y.o.  Admit date: 08/15/2014 Admission Diagnoses: 40 weeks / active labor / previous cesarean section - desires repeat  Discharge date:  08/16/2014 Discharge Diagnoses: POD 1 s/p repeat cesarean section  Prenatal history: G2P2002   EDC : 08/14/2014, Alternate EDD Entry  Prenatal care at [facility]  Primary provider : Mody Prenatal course complicated by previous CS  Prenatal Labs: ABO, Rh: --/--/A POS, A POS (06/08 1210)  Antibody: NEG (06/08 1210) Rubella:    Immune RPR: Non Reactive (06/08 1210)  HBsAg:  negative  HIV:   NR GTT : nl  Medical / Surgical History :  Past medical history:  Past Medical History  Diagnosis Date  . Elevated liver enzymes 2012    DURING FIRST PREGNANCY, RESOLVED  . Postpartum care following cesarean delivery (6/9) 08/15/2014    Past surgical history:  Past Surgical History  Procedure Laterality Date  . Wisdom tooth extraction  2008  . Cesarean section  06/15/2011    Procedure: CESAREAN SECTION;  Surgeon: Robley Fries, MD;  Location: [facility];  Service: Gynecology;  Laterality: N/A;  . Gum graft      Family History:  Family History  Problem Relation Age of Onset  . Cancer Mother     uterine  . Heart failure Maternal Grandmother     Social History:  reports that she has never smoked. She has never used smokeless tobacco. She reports that she does not drink alcohol or use illicit drugs.  Allergies: Review of patient's allergies indicates no known allergies.   Current Medications at time of admission:  Prior to Admission medications   Medication Sig Start Date End Date Taking? Authorizing Provider  Prenatal Vit-Fe Fumarate-FA (PRENATAL MULTIVITAMIN) TABS Take 1 tablet by mouth at bedtime.    Yes Historical Provider, MD  ibuprofen (ADVIL,MOTRIN) 600 MG tablet Take 1 tablet (600 mg total) by mouth every 6 (six) hours. 08/16/14    Marlinda Mike, CNM  oxyCODONE-acetaminophen (PERCOCET/ROXICET) 5-325 MG per tablet Take 1 tablet by mouth every 4 (four) hours as needed (for pain scale 4-7). 08/16/14   Marlinda Mike, CNM    Procedures: Cesarean section delivery on 08/15/2014 with delivery of viable newborn by Dr Juliene Pina   See operative report for further details APGAR (1 MIN): 9   APGAR (5 MINS): 9    Postoperative / postpartum course:  Uncomplicated with discharge on POD 1 per MD recommendation   Discharge Instructions:  Discharged Condition: stable  Activity: pelvic rest and postoperative restrictions x 2   Diet: routine  Medications:    Medication List    TAKE these medications        ibuprofen 600 MG tablet  Commonly known as:  ADVIL,MOTRIN  Take 1 tablet (600 mg total) by mouth every 6 (six) hours.     oxyCODONE-acetaminophen 5-325 MG per tablet  Commonly known as:  PERCOCET/ROXICET  Take 1 tablet by mouth every 4 (four) hours as needed (for pain scale 4-7).     prenatal multivitamin Tabs tablet  Take 1 tablet by mouth at bedtime.        Wound Care: keep clean and dry / remove honeycomb POD 3 Postpartum Instructions: [facility] discharge booklet - instructions reviewed  Discharge to: Home  Follow up :   [facility] in 6 weeks for routine postpartum visit with DR Juliene Pina  Signed: Marlinda Mike CNM, MSN, FACNM 08/16/2014, 5:43 PM

## 2014-08-16 NOTE — Progress Notes (Signed)
Patient ID: Christine Garner, female   DOB: 1985/04/27, 29 y.o.   MRN: [id number] Subjective: POD# 1 S/P Repeat Cesarean Delivery / Polyhydramnios / Omental Lesion Removal Information for the patient's newborn:  Azriella, Jusko [[id number]]  female  / circ done  Reports feeling well - desires early discharge after 5 pm Feeding: breast Patient reports tolerating PO.  Breast symptoms: none Pain controlled with ibuprofen (OTC) and narcotic analgesics including Percocet Denies HA/SOB/C/P/N/V/dizziness. Flatus present. No BM. She reports vaginal bleeding as normal, without clots.  She is ambulating, urinating without difficult.     Objective:   VS:  Filed Vitals:   08/15/14 2135 08/15/14 2140 08/16/14 0120 08/16/14 0546  BP: 93/52 95/49 96/53  95/49  Pulse: 65 68 63 61  Temp:   98.4 F (36.9 C) 98.6 F (37 C)  TempSrc:   Oral Oral  Resp: 18  18 18   SpO2:   97% 95%     Intake/Output Summary (Last 24 hours) at 08/16/14 1101 Last data filed at 08/16/14 1000  Gross per 24 hour  Intake   4234 ml  Output   2500 ml  Net   1734 ml        Recent Labs  08/14/14 1210 08/16/14 0600  WBC 8.8 12.8*  HGB 11.8* 10.0*  HCT 35.0* 30.1*  PLT 204 166     Blood type: A POS (06/08 1210)  Rubella:  Immune    Physical Exam:  General: alert, cooperative and no distress CV: Regular rate and rhythm, S1S2 present or without murmur or extra heart sounds Resp: clear Abdomen: soft, nontender, normal bowel sounds Incision: Tegaderm and Honeycomb C/D/I; skin well-approximated with sutures / no erythema, ecchymosis, edema, or drainage Uterine Fundus: firm, 1 FB below umbilicus, nontender Lochia: minimal Ext: extremities normal, atraumatic, no cyanosis or edema, Homans sign is negative, no sign of DVT and no edema, redness or tenderness in the calves or thighs   Assessment/Plan: 29 y.o.   POD# 1.  s/p Cesarean Delivery.  Indications: repeat                Principal Problem:   Postpartum care  following cesarean delivery (6/9) Active Problems:  Removal of Omental Lesion   Doing well, stable.               Regular diet as tolerated Ambulate Routine post-op care Consider early discharge home today after 5pm - ok per Dr. Juliene Pina / Nursery notified by Dr. Aura Fey, Terence Lux, MSN, CNM 08/16/2014, 11:01 AM

## 2014-08-17 ENCOUNTER — Encounter (HOSPITAL_COMMUNITY): Payer: Self-pay | Admitting: Obstetrics

## 2014-08-18 NOTE — Addendum Note (Signed)
Addendum  created 08/18/14 0956 by Graciela Husbands, CRNA   Modules edited: Notes Section   Notes Section:  File: [id number]

## 2014-08-18 NOTE — Anesthesia Postprocedure Evaluation (Signed)
Anesthesia Post Note  Patient: Christine Garner  Procedure(s) Performed: Procedure(s) (LRB): CESAREAN SECTION (N/A)  Anesthesia type: Spinal  Patient location: Home  Post pain: Pain level controlled  Post assessment: Post-op Vital signs reviewed  Last Vitals:  Filed Vitals:   08/16/14 0546  BP: 95/49  Pulse: 61  Temp: 37 C  Resp: 18    Post vital signs: Reviewed  Level of consciousness: awake  Complications: No apparent anesthesia complications. Discharge note reviewed. Patient ambulating without difficulty, VSS, requested early discharge from hospital.

## 2015-05-09 MED FILL — DROSPIR-ETH ESTRA 3/.02 MG: 3-0.02 | 84 days supply | Qty: 84 | Fill #2

## 2015-07-31 MED FILL — DROSPIR-ETH ESTRA 3/.02 MG: 3-0.02 | 84 days supply | Qty: 84 | Fill #3

## 2015-08-13 DIAGNOSIS — B373 Candidiasis of vulva and vagina: Secondary | ICD-10-CM | POA: Diagnosis not present

## 2015-08-13 DIAGNOSIS — L298 Other pruritus: Secondary | ICD-10-CM | POA: Diagnosis not present

## 2015-08-13 MED FILL — NYSTATIN/TRIAMCINOLONE CRM: 100000-0.1 | 10 days supply | Qty: 60 | Fill #0

## 2015-08-13 MED FILL — FLUCONAZOLE 150 MG TABLET: 150 | 6 days supply | Qty: 3 | Fill #0

## 2015-09-26 DIAGNOSIS — B373 Candidiasis of vulva and vagina: Secondary | ICD-10-CM | POA: Diagnosis not present

## 2015-09-29 MED FILL — NYSTATIN/TRIAMCINOLONE CRM: 100000-0.1 | 10 days supply | Qty: 30 | Fill #0

## 2015-11-14 DIAGNOSIS — Z01419 Encounter for gynecological examination (general) (routine) without abnormal findings: Secondary | ICD-10-CM | POA: Diagnosis not present

## 2015-11-14 DIAGNOSIS — Z6824 Body mass index (BMI) 24.0-24.9, adult: Secondary | ICD-10-CM | POA: Diagnosis not present

## 2015-11-14 MED FILL — BEYAZ 28 TABLET: 3-0.02-0.45 | 28 days supply | Qty: 28 | Fill #0

## 2015-11-25 DIAGNOSIS — Z111 Encounter for screening for respiratory tuberculosis: Secondary | ICD-10-CM | POA: Diagnosis not present

## 2015-11-25 DIAGNOSIS — R5383 Other fatigue: Secondary | ICD-10-CM | POA: Diagnosis not present

## 2015-11-25 DIAGNOSIS — Z23 Encounter for immunization: Secondary | ICD-10-CM | POA: Diagnosis not present

## 2015-11-25 DIAGNOSIS — R55 Syncope and collapse: Secondary | ICD-10-CM | POA: Diagnosis not present

## 2015-11-25 DIAGNOSIS — E782 Mixed hyperlipidemia: Secondary | ICD-10-CM | POA: Diagnosis not present

## 2015-11-25 DIAGNOSIS — R7301 Impaired fasting glucose: Secondary | ICD-10-CM | POA: Diagnosis not present

## 2015-12-02 ENCOUNTER — Other Ambulatory Visit: Payer: Self-pay | Admitting: Family

## 2015-12-02 DIAGNOSIS — R55 Syncope and collapse: Secondary | ICD-10-CM

## 2015-12-16 ENCOUNTER — Other Ambulatory Visit: Payer: Self-pay

## 2015-12-16 ENCOUNTER — Ambulatory Visit (HOSPITAL_COMMUNITY): Payer: 59 | Attending: Cardiovascular Disease

## 2015-12-16 DIAGNOSIS — R55 Syncope and collapse: Secondary | ICD-10-CM | POA: Diagnosis not present

## 2015-12-19 MED FILL — BEYAZ 28 TABLET: 3-0.02-0.45 | 28 days supply | Qty: 28 | Fill #1

## 2016-01-19 MED FILL — BEYAZ 28 TABLET: 3-0.02-0.45 | 28 days supply | Qty: 28 | Fill #2

## 2016-02-09 MED FILL — BEYAZ 28 TABLET: 3-0.02-0.45 | 28 days supply | Qty: 28 | Fill #3

## 2016-03-04 MED FILL — BEYAZ 28 TABLET: 3-0.02-0.45 | 28 days supply | Qty: 28 | Fill #4

## 2016-11-11 ENCOUNTER — Telehealth (INDEPENDENT_AMBULATORY_CARE_PROVIDER_SITE_OTHER): Payer: Self-pay | Admitting: Specialist

## 2016-11-11 ENCOUNTER — Ambulatory Visit (INDEPENDENT_AMBULATORY_CARE_PROVIDER_SITE_OTHER): Payer: BLUE CROSS/BLUE SHIELD | Admitting: Surgery

## 2016-11-11 DIAGNOSIS — M542 Cervicalgia: Secondary | ICD-10-CM

## 2016-11-11 DIAGNOSIS — M4722 Other spondylosis with radiculopathy, cervical region: Secondary | ICD-10-CM

## 2016-11-11 MED ORDER — GABAPENTIN 100 MG PO CAPS: 100.0000 mg | ORAL_CAPSULE | Freq: Every day | ORAL | 1 refills | Status: DC

## 2016-11-11 MED ORDER — MELOXICAM 7.5 MG PO TABS: 7.5000 mg | ORAL_TABLET | Freq: Every day | ORAL | 2 refills | Status: DC

## 2016-11-11 NOTE — Telephone Encounter (Signed)
Dr. Otelia SergeantNitka wants to see her in two weeks for an MRI Review. There is nothing on his schedule, can you please fit her into his schedule.  CB#[phone].  Thank you.

## 2016-11-22 DIAGNOSIS — M4722 Other spondylosis with radiculopathy, cervical region: Secondary | ICD-10-CM | POA: Diagnosis not present

## 2016-11-22 DIAGNOSIS — M542 Cervicalgia: Secondary | ICD-10-CM | POA: Diagnosis not present

## 2016-11-23 NOTE — Telephone Encounter (Signed)
sched for 12/02/16

## 2016-12-02 ENCOUNTER — Ambulatory Visit (INDEPENDENT_AMBULATORY_CARE_PROVIDER_SITE_OTHER): Payer: BLUE CROSS/BLUE SHIELD | Admitting: Specialist

## 2016-12-02 ENCOUNTER — Encounter (INDEPENDENT_AMBULATORY_CARE_PROVIDER_SITE_OTHER): Payer: Self-pay | Admitting: Specialist

## 2016-12-02 VITALS — BP 125/85 | HR 70

## 2016-12-02 DIAGNOSIS — S46911S Strain of unspecified muscle, fascia and tendon at shoulder and upper arm level, right arm, sequela: Secondary | ICD-10-CM

## 2016-12-02 NOTE — Addendum Note (Signed)
Addended by: Vira Browns on: 12/02/2016 06:48 PM   Modules accepted: Orders

## 2016-12-02 NOTE — Patient Instructions (Signed)
Avoid overhead lifting and overhead use of the arms. Do not lift greater than 10 lbs. Tylenol ES one every 6-8 hours for pain and inflamation.  Continue with PT for another 2-3 weeks, then a home exercise program.

## 2016-12-02 NOTE — Progress Notes (Addendum)
Office Visit Note   Patient: Christine Garner           Date of Birth: [birth year]           MRN: [id number] Visit Date: 12/02/2016              Requested by: Gordan Payment., MD [address] [place], [place] [address] PCP: Gordan Payment., MD   Assessment & Plan: Visit Diagnoses:  1. 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 No acute findings. Her pain is unilateral to the right and superior scapula angle and medial scapula border. There are no long track findings or focal neurologic deficits. Recommend for PT for right periscapular myofascial strain. Tylenol or advil or alleve for discomfort. The gabapentin even at a lower dose 100 mg was not tolerated and will be discontinued.   Plan: Avoid overhead lifting and overhead use of the arms. Do not lift greater than 10 lbs. Tylenol ES one every 6-8 hours for pain and inflamation.  Continue with PT for another 2-3 weeks, then a home exercise program.  Follow-Up Instructions: Return in about 4 weeks (around 12/30/2016).   Orders:  Orders Placed This Encounter  Procedures  . Ambulatory referral to Physical Therapy   No orders of the defined types were placed in this encounter.     Procedures: No procedures performed   Clinical Data: Findings:  Results of the MRI of the cervical spine shows a small central disc protrusion C5-6  And minimal assymetric disc bulge and spur to the left at C3-4 the overall dimensions of the Cervical canal are narrowed at the C5-6 and C4-5 levels and the cord does show subtle ventral impression at the C5-6 level. No signal changes noted in the cervical cord.     Subjective: Chief Complaint  Patient presents with  . Neck - Follow-up    MRI Review    31 year old right handed female with persistent ongoing pain  into the right trapezeius and right neck and along the right superior scapula angle. Feeling of heaviness with use of the Right arm in raising above her head. No numbness or paresthesias. She has stiffness since a fall over 6 months ago. MRI has been done. 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 Reports that the right neck and scapula pain started with  A fall forward landing on the right shoulder. No gait disturbance no leg numbness or weakness. No bowel or bladder difficulty. She describes the pain as not being excruciating but it is a nuisance and is annoying it that it is still present.     Review of Systems  Constitutional: Negative.   HENT: Negative.   Eyes: Negative.   Respiratory: Negative.   Cardiovascular: Negative.   Gastrointestinal: Negative.   Endocrine: Negative.   Genitourinary: Negative.   Musculoskeletal: Positive for myalgias, neck pain and neck stiffness.  Skin: Negative.   Allergic/Immunologic: Negative.   Neurological: Negative.   Hematological: Negative.   Psychiatric/Behavioral: Negative.      Objective: Vital Signs: BP 125/85 (BP Location: Left Arm, Patient Position: Sitting)   Pulse 70   Physical Exam  Constitutional: She is oriented to person, place, and time. She appears well-developed  and well-nourished.  HENT:  Head: Normocephalic and atraumatic.  Eyes: Pupils are equal, round, and reactive to light. EOM are normal.  Neck: Normal range of motion. Neck supple.  Pulmonary/Chest: Effort normal and breath sounds normal.  Abdominal: Soft. Bowel sounds are normal.  Neurological: She is alert and oriented to person, place, and time.  Skin: Skin is warm and dry.  Psychiatric: She has a normal mood and affect. Her behavior is normal. Judgment and thought content normal.    Back Exam   Tenderness  The patient is  experiencing tenderness in the cervical.  Range of Motion  Extension: normal  Flexion: normal  Lateral Bend Right:  80 abnormal  Lateral Bend Left: normal  Rotation Right:  70 abnormal  Rotation Left: normal   Muscle Strength  Right Quadriceps:  5/5  Left Quadriceps:  5/5  Right Hamstrings:  5/5  Left Hamstrings:  5/5   Tests  Straight leg raise right: negative Straight leg raise left: negative  Reflexes  Biceps: normal Babinski's sign: normal   Other  Toe Walk: normal Heel Walk: normal Sensation: normal Gait: normal  Erythema: no back redness Scars: absent  Comments:  Hoffman sign is negative.  Tender right superior scapula angle. Tender to palpation right SSA and a palpable crepitus about the right SSA noted with ROM and pressure here.       Specialty Comments:  No specialty comments available.  Imaging: No results found.   PMFS History: Patient Active Problem List   Diagnosis Date Noted  . Postpartum care following cesarean delivery (6/9) 08/15/2014  . Status post repeat low transverse cesarean section 08/15/2014   Past Medical History:  Diagnosis Date  . Elevated liver enzymes 2012   DURING FIRST PREGNANCY, RESOLVED  . Postpartum care following cesarean delivery (6/9) 08/15/2014    Family History  Problem Relation Age of Onset  . Cancer Mother        uterine  . Heart failure Maternal Grandmother     Past Surgical History:  Procedure Laterality Date  . CESAREAN SECTION  06/15/2011   Procedure: CESAREAN SECTION;  Surgeon: Robley Fries, MD;  Location: [facility];  Service: Gynecology;  Laterality: N/A;  . CESAREAN SECTION N/A 08/15/2014   Procedure: CESAREAN SECTION;  Surgeon: Noland Fordyce, MD;  Location: [facility];  Service: Obstetrics;  Laterality: N/A;  . gum graft    . WISDOM TOOTH EXTRACTION  2008   Social History   Occupational History  . Not on file.   Social History Main Topics  . Smoking status: Never Smoker  . Smokeless tobacco:  Never Used  . Alcohol use No  . Drug use: No  . Sexual activity: Yes

## 2016-12-09 DIAGNOSIS — Z23 Encounter for immunization: Secondary | ICD-10-CM | POA: Diagnosis not present

## 2017-03-04 ENCOUNTER — Encounter (INDEPENDENT_AMBULATORY_CARE_PROVIDER_SITE_OTHER): Payer: Self-pay | Admitting: Surgery

## 2017-03-04 NOTE — Progress Notes (Signed)
Office Visit Note   Patient: Christine MarkerHeather N Garner           Date of Birth: 03/22/1985           MRN: [id number] Visit Date: 11/11/2016              Requested by: Gordan PaymentGrisso, Greg A., MD [address] [place], [place] [id number] PCP: Gordan PaymentGrisso, Greg A., MD   Assessment & Plan: Visit Diagnoses:  1. Other spondylosis with radiculopathy, cervical region   2. Cervicalgia     Plan: The patient's ongoing complaints and worsening symptoms we'll schedule MRI to rule out HNP/stenosis. Follow-up in the office after completion to discuss results and further treatment options.  Follow-Up Instructions: Return in about 2 years (around 11/12/2018) for Dr. Otelia Sergeantnitka.   Orders:  Orders Placed This Encounter  Procedures  . MR CERVICAL SPINE WO CONTRAST   Meds ordered this encounter  Medications  . DISCONTD: gabapentin (NEURONTIN) 100 MG capsule    Sig: Take 1 capsule (100 mg total) by mouth at bedtime.    Dispense:  30 capsule    Refill:  1  . meloxicam (MOBIC) 7.5 MG tablet    Sig: Take 1 tablet (7.5 mg total) by mouth daily.    Dispense:  30 tablet    Refill:  2      Procedures: No procedures performed   Clinical Data: No additional findings.   Subjective: No chief complaint on file.   HPI Patient comes in today with complaints of worsening neck pain and right upper extremity radiculopathy. States that he severed 2017 she had an injury where she fell hitting the side of her head. Has  pain radiating into the right shoulder and scapular area. Occasional pain into her right arm. Denies numbness and tingling. States that her right arm feels heavy. She's tried taking Tylenol and ibuprofen without any relief. She is also use heating pad and salonpas.  Pain worse when doing long drives and turning movements.    Objective: Vital Signs: There were no vitals taken for this visit.  Physical Exam  Constitutional: She is oriented to person, place, and time. She appears well-developed. No distress.  HENT:   Head: Normocephalic and atraumatic.  Eyes: EOM are normal. Pupils are equal, round, and reactive to light.  Neck:  Decreased service spine range of motion secondary to discomfort. Positive right brachial plexuses and trapezius tenderness.  Pulmonary/Chest: No respiratory distress.  Abdominal: She exhibits no distension.  Musculoskeletal:  Bilateral shoulder exam unremarkable. Negative impingement test. Trace right triceps and wrist flexion weakness. Sensation intact.  Neurological: She is alert and oriented to person, place, and time.  Skin: Skin is warm and dry.  Psychiatric: She has a normal mood and affect.    Ortho Exam  Specialty Comments:  No specialty comments available.  Imaging: No results found.   PMFS History: Patient Active Problem List   Diagnosis Date Noted  . Postpartum care following cesarean delivery (6/9) 08/15/2014  . Status post repeat low transverse cesarean section 08/15/2014   Past Medical History:  Diagnosis Date  . Elevated liver enzymes 2012   DURING FIRST PREGNANCY, RESOLVED  . Postpartum care following cesarean delivery (6/9) 08/15/2014    Family History  Problem Relation Age of Onset  . Cancer Mother        uterine  . Heart failure Maternal Grandmother     Past Surgical History:  Procedure Laterality Date  . CESAREAN SECTION  06/15/2011   Procedure:  CESAREAN SECTION;  Surgeon: Robley FriesVaishali R Mody, MD;  Location: [facility];  Service: Gynecology;  Laterality: N/A;  . CESAREAN SECTION N/A 08/15/2014   Procedure: CESAREAN SECTION;  Surgeon: Noland FordyceKelly Fogleman, MD;  Location: [facility];  Service: Obstetrics;  Laterality: N/A;  . gum graft    . WISDOM TOOTH EXTRACTION  2008   Social History   Occupational History  . Not on file  Tobacco Use  . Smoking status: Never Smoker  . Smokeless tobacco: Never Used  Substance and Sexual Activity  . Alcohol use: No  . Drug use: No  . Sexual activity: Yes

## 2017-03-10 DIAGNOSIS — Z6824 Body mass index (BMI) 24.0-24.9, adult: Secondary | ICD-10-CM | POA: Diagnosis not present

## 2017-03-10 DIAGNOSIS — N9411 Superficial (introital) dyspareunia: Secondary | ICD-10-CM | POA: Diagnosis not present

## 2017-03-10 DIAGNOSIS — Z01419 Encounter for gynecological examination (general) (routine) without abnormal findings: Secondary | ICD-10-CM | POA: Diagnosis not present

## 2017-04-29 DIAGNOSIS — N3090 Cystitis, unspecified without hematuria: Secondary | ICD-10-CM | POA: Diagnosis not present

## 2017-05-10 ENCOUNTER — Telehealth: Payer: Self-pay | Admitting: Cardiology

## 2017-05-10 NOTE — Telephone Encounter (Signed)
Changed PCP from Dr. Shary DecampGrisso to Dr. Ardelle ParkHaque. Patient is bringing copy of EKG with her to appointment with Dr. Dulce SellarMunley 05/12/17. Also advised patient to bring a list of her current medications. No further questions.

## 2017-05-10 NOTE — Telephone Encounter (Signed)
Returned your call.

## 2017-05-12 ENCOUNTER — Ambulatory Visit (INDEPENDENT_AMBULATORY_CARE_PROVIDER_SITE_OTHER): Payer: BLUE CROSS/BLUE SHIELD | Admitting: Cardiology

## 2017-05-12 VITALS — BP 110/70 | HR 70 | Ht 60.0 in | Wt 124.0 lb

## 2017-05-12 DIAGNOSIS — I493 Ventricular premature depolarization: Secondary | ICD-10-CM

## 2017-05-12 DIAGNOSIS — R002 Palpitations: Secondary | ICD-10-CM | POA: Insufficient documentation

## 2017-05-12 DIAGNOSIS — R9431 Abnormal electrocardiogram [ECG] [EKG]: Secondary | ICD-10-CM

## 2017-05-12 DIAGNOSIS — R7301 Impaired fasting glucose: Secondary | ICD-10-CM

## 2017-05-12 DIAGNOSIS — E782 Mixed hyperlipidemia: Secondary | ICD-10-CM | POA: Insufficient documentation

## 2017-05-12 DIAGNOSIS — R55 Syncope and collapse: Secondary | ICD-10-CM

## 2017-05-12 HISTORY — DX: Syncope and collapse: R55

## 2017-05-12 HISTORY — DX: Mixed hyperlipidemia: E78.2

## 2017-05-12 HISTORY — DX: Impaired fasting glucose: R73.01

## 2017-05-12 NOTE — Patient Instructions (Addendum)
Medication Instructions:  Your physician recommends that you continue on your current medications as directed. Please refer to the Current Medication list given to you today.   Labwork: Your physician recommends that you return for lab work today: CBC, BMP, Magnesium.   Testing/Procedures: None  Follow-Up: Your physician recommends that you schedule a follow-up appointment in: 2 weeks.   Any Other Special Instructions Will Be Listed Below (If Applicable).     If you need a refill on your cardiac medications before your next appointment, please call your pharmacy.     1. Avoid all over-the-counter antihistamines except Claritin/Loratadine and Zyrtec/Cetrizine. 2. Avoid all combination including cold sinus allergies flu decongestant and sleep medications 3. You can use Robitussin DM Mucinex and Mucinex DM for cough. 4. can use Tylenol aspirin ibuprofen and naproxen but no combinations such as sleep or sinus.

## 2017-05-12 NOTE — Progress Notes (Signed)
Cardiology Office Note:    Date:  05/12/2017   ID:  Christine Garner, DOB Oct 19, 1985, MRN [id number]  PCP:  Shelbie Ammons, MD  Cardiologist:  Norman Herrlich, MD   Referring MD: Gordan Payment., MD  ASSESSMENT:    1. Palpitation   2. PVC (premature ventricular contraction)   3. Abnormal EKG   4. Syncope and collapse    PLAN:    In order of problems listed above:  1. Symptoms appear to be symptomatic PVCs, however she wore monitor over the weekend results pending and after discussion with patient and husband they prefer to wait on the results before initiating treatment with a low-dose of selective beta-blocker.  I drew labs to look for precipitating event anemia thyroid excess potassium magnesium depletion and instructed her medications to avoid for proarrhythmia.  I plan to evaluate for cardiomyopathy but she had an echocardiogram performed in the last 2 years that was normal. 2. Her EKG showed a single PVC which seemed to correspond with her symptoms however I am just not sure if she has significant arrhythmia and await results of the Holter monitor 3. Nonspecific changes check electrolytes I would not repeat an echocardiogram at this time 4. Previous history of syncope unremarkable evaluation normal Holter monitor 2017.  Her husband tells me heart rates in the 40s am unsure whether she has true bradycardia or compensatory pauses after PVCs and hold on beta-blocker and treatment pending results of her Holter  Next appointment 2 weeks    Medication Adjustments/Labs and Tests Ordered: Current medicines are reviewed at length with the patient today.  Concerns regarding medicines are outlined above.  Orders Placed This Encounter  Procedures  . CBC  . Basic Metabolic Panel (BMET)  . Magnesium   No orders of the defined types were placed in this encounter.    Chief Complaint  Patient presents with  . New Patient (Initial Visit)    to evaluate "skipping heart beats"    . Abnormal ECG     History of Present Illness:    Christine Garner is a 32 y.o. female who is being seen today for the evaluation of abnormal EKG at her request. She was having palpitation and an EKG showed a PVC which occurred with symptom. In October 2017 she had syncope and an ECHO at [facility] was normal. She wore a 48 hr Holter taken off Monday results are pending. Her home HR monitor shows rates as low as 40 BPM.  Her mother has a bicuspid aortic valve there is no family history of sudden cardiac death or channel disturbance.  She has had no recurrent syncope she continues to have palpitation which waxes and wanes daytime but particularly at rest and not with physical effort.  Overall she is fatigued.  She has had no lab work done in the last few years when she has palpitation she is breathless but no exertional shortness of breath edema orthopnea   Past Medical History:  Diagnosis Date  . Elevated liver enzymes 2012   DURING FIRST PREGNANCY, RESOLVED  . Impaired fasting glucose 05/12/2017  . Mixed hyperlipidemia 05/12/2017  . Postpartum care following cesarean delivery (6/9) 08/15/2014  . Syncope and collapse 05/12/2017    Past Surgical History:  Procedure Laterality Date  . CESAREAN SECTION  06/15/2011   Procedure: CESAREAN SECTION;  Surgeon: Robley Fries, MD;  Location: [facility];  Service: Gynecology;  Laterality: N/A;  . CESAREAN SECTION N/A 08/15/2014   Procedure: CESAREAN SECTION;  Surgeon: Noland FordyceKelly Fogleman, MD;  Location: [facility];  Service: Obstetrics;  Laterality: N/A;  . gum graft    . WISDOM TOOTH EXTRACTION  2008    Current Medications: Current Meds  Medication Sig  . Drospirenone-Ethinyl Estradiol-Levomefol (BEYAZ) 3-0.02-0.451 MG tablet Take 1 tablet by mouth daily.     Allergies:   Patient has no known allergies.   Social History   Socioeconomic History  . Marital status: Married    Spouse name: None  . Number of children: None  . Years of education: None  . Highest education level: None    Social Needs  . Financial resource strain: None  . Food insecurity - worry: None  . Food insecurity - inability: None  . Transportation needs - medical: None  . Transportation needs - non-medical: None  Occupational History  . None  Tobacco Use  . Smoking status: Never Smoker  . Smokeless tobacco: Never Used  Substance and Sexual Activity  . Alcohol use: No  . Drug use: No  . Sexual activity: Yes  Other Topics Concern  . None  Social History Narrative  . None     Family History: The patient's family history includes Cancer in her mother; Heart failure in her maternal grandmother.  ROS:   Review of Systems  Constitution: Positive for malaise/fatigue.  HENT: Negative.   Eyes: Negative.   Cardiovascular: Positive for palpitations.  Respiratory: Positive for shortness of breath.   Endocrine: Negative.   Hematologic/Lymphatic: Negative.   Skin: Negative.   Musculoskeletal: Negative.   Gastrointestinal: Negative.   Genitourinary: Negative.   Neurological: Negative.   Psychiatric/Behavioral: Negative.   Allergic/Immunologic: Negative.    Please see the history of present illness.     All other systems reviewed and are negative.  EKGs/Labs/Other Studies Reviewed:    The following studies were reviewed today: Echocardiogram 2017 normal  EKG:  05/06/17 Sinus tachycardia 103 BPM nonspecific ST T changes and 1 PVC.  Recent Labs: No results found for requested labs within last 8760 hours.  Recent Lipid Panel No results found for: CHOL, TRIG, HDL, CHOLHDL, VLDL, LDLCALC, LDLDIRECT  Physical Exam:    VS:  BP 110/70 (BP Location: Left Arm, Patient Position: Sitting, Cuff Size: Normal)   Pulse 70   Ht 5' (1.524 m)   Wt 124 lb (56.2 kg)   SpO2 99%   BMI 24.22 kg/m     Wt Readings from Last 3 Encounters:  05/12/17 124 lb (56.2 kg)  08/14/14 150 lb (68 kg)  04/03/12 116 lb (52.6 kg)     GEN:  Well nourished, well developed in no acute distress HEENT:  Normal NECK: No JVD; No carotid bruits LYMPHATICS: No lymphadenopathy CARDIAC: No arrhythmia during examRRR, no murmurs, rubs, gallops RESPIRATORY:  Clear to auscultation without rales, wheezing or rhonchi  ABDOMEN: Soft, non-tender, non-distended MUSCULOSKELETAL:  No edema; No deformity  SKIN: Warm and dry NEUROLOGIC:  Alert and oriented x 3 PSYCHIATRIC:  Normal affect     Signed, Norman HerrlichBrian Armetta Henri, MD  05/12/2017 12:53 PM    [facility]

## 2017-05-13 DIAGNOSIS — I493 Ventricular premature depolarization: Secondary | ICD-10-CM | POA: Diagnosis not present

## 2017-05-13 LAB — BASIC METABOLIC PANEL
BUN / CREAT RATIO: 14 (ref 9–23)
BUN: 11 mg/dL (ref 6–20)
CALCIUM: 9.1 mg/dL (ref 8.7–10.2)
CO2: 22 mmol/L (ref 20–29)
CREATININE: 0.78 mg/dL (ref 0.57–1.00)
Chloride: 103 mmol/L (ref 96–106)
GFR calc Af Amer: 119 mL/min/{1.73_m2} (ref >59)
GFR calc non Af Amer: 103 mL/min/{1.73_m2} (ref >59)
GLUCOSE: 85 mg/dL (ref 65–99)
Potassium: 4.4 mmol/L (ref 3.5–5.2)
Sodium: 139 mmol/L (ref 134–144)

## 2017-05-13 LAB — CBC
HEMOGLOBIN: 13 g/dL (ref 11.1–15.9)
Hematocrit: 39 % (ref 34.0–46.6)
MCH: 31.6 pg (ref 26.6–33.0)
MCHC: 33.3 g/dL (ref 31.5–35.7)
MCV: 95 fL (ref 79–97)
Platelets: 277 10*3/uL (ref 150–379)
RBC: 4.12 x10E6/uL (ref 3.77–5.28)
RDW: 12.8 % (ref 12.3–15.4)
WBC: 6.8 10*3/uL (ref 3.4–10.8)

## 2017-05-13 LAB — MAGNESIUM: MAGNESIUM: 2 mg/dL (ref 1.6–2.3)

## 2017-05-17 ENCOUNTER — Other Ambulatory Visit: Payer: Self-pay | Admitting: Cardiology

## 2017-05-17 ENCOUNTER — Telehealth: Payer: Self-pay

## 2017-05-17 DIAGNOSIS — I493 Ventricular premature depolarization: Secondary | ICD-10-CM

## 2017-05-17 MED ORDER — ACEBUTOLOL HCL 200 MG PO CAPS: 200.0000 mg | ORAL_CAPSULE | Freq: Every day | ORAL | 1 refills | Status: AC

## 2017-05-17 NOTE — Telephone Encounter (Signed)
-----   Message from Baldo DaubBrian J Munley, MD sent at 05/17/2017  8:19 AM EDT ----- Monitor with PVC's, bigeminy and trigeminy  start a low dose beta blocker, Acebutolol 200 mg a day

## 2017-05-17 NOTE — Telephone Encounter (Signed)
Advised patient that Dr. Dulce SellarMunley received and reviewed the monitor report that was faxed. Advised for patient to start acebutolol 200 mg daily for bigeminy and trigeminy. Patient verbalized understanding. Advised patient to keep appointment scheduled for 05/26/17. Patient verbalized understanding. No further questions.

## 2017-05-25 NOTE — Progress Notes (Signed)
Cardiology Office Note:    Date:  05/27/2017   ID:  Christine MarkerHeather N Garner, DOB 09/20/1985, MRN [id number]  PCP:  [facility], Christine P, MD  Cardiologist:  Christine HerrlichBrian Leler Brion, MD    Referring MD: [facility], Christine P, MD    ASSESSMENT:    1. Frequent PVCs    PLAN:    In order of problems listed above:  1. Improved asymptomatic she will take beta-blocker in the future on a as needed basis.  In retrospect her recent episode was precipitated by using pediatric Benadryl she was given instructions and avoiding over-the-counter proarrhythmic medications.  If having frequent PVCs I would keep her on a maintenance beta-blocker and if she failed start low-dose antiarrhythmic drug like flecainide.  She responded well to reassurance.   Next appointment: As needed soon after her last visit her palpitation resolved   Medication Adjustments/Labs and Tests Ordered: Current medicines are reviewed at length with the patient today.  Concerns regarding medicines are outlined above.  No orders of the defined types were placed in this encounter.  No orders of the defined types were placed in this encounter.   Chief Complaint  Patient presents with  . Follow-up    History of Present Illness:    Christine Garner is a 32 y.o. female with a hx of palpitation and PVC's with bigeminy and trigeminy last seen 05/12/17. Compliance with diet, lifestyle and medications: yes Soon after her recent visit her palpitation resolved in retrospect she attributes it to using pediatric Benadryl.  I reviewed her event monitor she had frequent PVCs bigeminy trigeminy and no bradycardia. Past Medical History:  Diagnosis Date  . Elevated liver enzymes 2012   DURING FIRST PREGNANCY, RESOLVED  . Impaired fasting glucose 05/12/2017  . Mixed hyperlipidemia 05/12/2017  . Postpartum care following cesarean delivery (6/9) 08/15/2014  . Syncope and collapse 05/12/2017    Past Surgical History:  Procedure Laterality Date  . CESAREAN SECTION  06/15/2011   Procedure: CESAREAN SECTION;  Surgeon: Christine FriesVaishali R Mody, MD;  [facility];  Service: Gynecology;  Laterality: N/A;  . CESAREAN SECTION N/A 08/15/2014   Procedure: CESAREAN SECTION;  Surgeon: Christine FordyceKelly Fogleman, MD;  Location: [facility];  Service: Obstetrics;  Laterality: N/A;  . gum graft    . WISDOM TOOTH EXTRACTION  2008    Current Medications: Current Meds  Medication Sig  . Drospirenone-Ethinyl Estradiol-Levomefol (BEYAZ) 3-0.02-0.451 MG tablet Take 1 tablet by mouth daily.     Allergies:   Patient has no known allergies.   Social History   Socioeconomic History  . Marital status: Married    Spouse name: Not on file  . Number of children: Not on file  . Years of education: Not on file  . Highest education level: Not on file  Occupational History  . Not on file  Social Needs  . Financial resource strain: Not on file  . Food insecurity:    Worry: Not on file    Inability: Not on file  . Transportation needs:    Medical: Not on file    Non-medical: Not on file  Tobacco Use  . Smoking status: Never Smoker  . Smokeless tobacco: Never Used  Substance and Sexual Activity  . Alcohol use: No  . Drug use: No  . Sexual activity: Yes  Lifestyle  . Physical activity:    Days per week: Not on file    Minutes per session: Not on file  . Stress: Not on file  Relationships  .  Social connections:    Talks on phone: Not on file    Gets together: Not on file    Attends religious service: Not on file    Active member of club or organization: Not on file    Attends meetings of clubs or organizations: Not on file    Relationship status: Not on file  Other Topics Concern  . Not on file  Social History Narrative  . Not on file     Family History: The patient's family history includes Cancer in her mother; Heart failure in her maternal grandmother. ROS:   Please see the history of present illness.    All other systems reviewed and are negative.  EKGs/Labs/Other Studies Reviewed:     The following studies were reviewed today:   Recent Labs: 05/12/2017: BUN 11; Creatinine, Ser 0.78; Hemoglobin 13.0; Magnesium 2.0; Platelets 277; Potassium 4.4; Sodium 139  Recent Lipid Panel No results found for: CHOL, TRIG, HDL, CHOLHDL, VLDL, LDLCALC, LDLDIRECT  Physical Exam:    VS:  BP 112/70 (BP Location: Right Arm, Patient Position: Sitting, Cuff Size: Normal)   Pulse 72   Ht 5' (1.524 m)   Wt 126 lb (57.2 kg)   SpO2 100%   BMI 24.61 kg/m     Wt Readings from Last 3 Encounters:  05/26/17 126 lb (57.2 kg)  05/12/17 124 lb (56.2 kg)  08/14/14 150 lb (68 kg)     GEN:  Well nourished, well developed in no acute distress HEENT: Normal NECK: No JVD; No carotid bruits LYMPHATICS: No lymphadenopathy CARDIAC: RRR, no murmurs, rubs, gallops RESPIRATORY:  Clear to auscultation without rales, wheezing or rhonchi  ABDOMEN: Soft, non-tender, non-distended MUSCULOSKELETAL:  No edema; No deformity  SKIN: Warm and dry NEUROLOGIC:  Alert and oriented x 3 PSYCHIATRIC:  Normal affect    Signed, Christine Herrlich, MD  05/27/2017 7:47 AM    [facility]

## 2017-05-26 ENCOUNTER — Ambulatory Visit (INDEPENDENT_AMBULATORY_CARE_PROVIDER_SITE_OTHER): Payer: BLUE CROSS/BLUE SHIELD | Admitting: Cardiology

## 2017-05-26 ENCOUNTER — Ambulatory Visit: Payer: Self-pay | Admitting: Cardiology

## 2017-05-26 ENCOUNTER — Encounter: Payer: Self-pay | Admitting: Cardiology

## 2017-05-26 DIAGNOSIS — I493 Ventricular premature depolarization: Secondary | ICD-10-CM | POA: Insufficient documentation

## 2017-05-26 NOTE — Patient Instructions (Addendum)
Medication Instructions:  Your physician recommends that you continue on your current medications as directed. Please refer to the Current Medication list given to you today.   Labwork: NONE  Testing/Procedures: NONE  Follow-Up: Your physician wants you to follow up PRN, or as needed.    Any Other Special Instructions Will Be Listed Below (If Applicable).     If you need a refill on your cardiac medications before your next appointment, please call your pharmacy.    1. Avoid all over-the-counter antihistamines except Claritin/Loratadine and Zyrtec/Cetrizine. 2. Avoid all combination including cold sinus allergies flu decongestant and sleep medications 3. You can use Robitussin DM Mucinex and Mucinex DM for cough. 4. can use Tylenol aspirin ibuprofen and naproxen but no combinations such as sleep or sinus.  KardiaMobile [email or web address]        FDA-cleared, clinical grade mobile EKG monitor: Lourena SimmondsKardia is the most clinically-validated mobile EKG used by the world's leading cardiac care medical professionals With Basic service, know instantly if your heart rhythm is normal or if atrial fibrillation is detected, and email the last single EKG recording to yourself or your doctor Premium service, available for purchase through the Kardia app for $9.99 per month or $99 per year, includes unlimited history and storage of your EKG recordings, a monthly EKG summary report to share with your doctor, along with the ability to track your blood pressure, activity and weight Includes one KardiaMobile phone clip FREE SHIPPING: Standard delivery 1-3 business days. Orders placed by 11:00am PST will ship that afternoon. Otherwise, will ship next business day. All orders ship via [employer] Priority Mail from [place]

## 2017-08-18 DIAGNOSIS — B373 Candidiasis of vulva and vagina: Secondary | ICD-10-CM | POA: Diagnosis not present

## 2017-08-18 DIAGNOSIS — Z130 Encounter for screening for diseases of the blood and blood-forming organs and certain disorders involving the immune mechanism: Secondary | ICD-10-CM | POA: Diagnosis not present

## 2017-08-18 DIAGNOSIS — Z1329 Encounter for screening for other suspected endocrine disorder: Secondary | ICD-10-CM | POA: Diagnosis not present

## 2017-08-18 DIAGNOSIS — Z13220 Encounter for screening for lipoid disorders: Secondary | ICD-10-CM | POA: Diagnosis not present

## 2017-08-18 DIAGNOSIS — Z3009 Encounter for other general counseling and advice on contraception: Secondary | ICD-10-CM | POA: Diagnosis not present

## 2017-08-18 DIAGNOSIS — Z0000 Encounter for general adult medical examination without abnormal findings: Secondary | ICD-10-CM | POA: Diagnosis not present

## 2018-09-14 DIAGNOSIS — Z20828 Contact with and (suspected) exposure to other viral communicable diseases: Secondary | ICD-10-CM | POA: Diagnosis not present

## 2018-10-17 DIAGNOSIS — J029 Acute pharyngitis, unspecified: Secondary | ICD-10-CM | POA: Diagnosis not present

## 2018-10-17 DIAGNOSIS — Z20828 Contact with and (suspected) exposure to other viral communicable diseases: Secondary | ICD-10-CM | POA: Diagnosis not present

## 2018-11-02 DIAGNOSIS — F411 Generalized anxiety disorder: Secondary | ICD-10-CM | POA: Diagnosis not present

## 2018-12-06 DIAGNOSIS — Z124 Encounter for screening for malignant neoplasm of cervix: Secondary | ICD-10-CM | POA: Diagnosis not present

## 2018-12-06 DIAGNOSIS — Z1151 Encounter for screening for human papillomavirus (HPV): Secondary | ICD-10-CM | POA: Diagnosis not present

## 2018-12-06 DIAGNOSIS — Z6824 Body mass index (BMI) 24.0-24.9, adult: Secondary | ICD-10-CM | POA: Diagnosis not present

## 2018-12-06 DIAGNOSIS — Z01419 Encounter for gynecological examination (general) (routine) without abnormal findings: Secondary | ICD-10-CM | POA: Diagnosis not present

## 2019-01-03 DIAGNOSIS — R87610 Atypical squamous cells of undetermined significance on cytologic smear of cervix (ASC-US): Secondary | ICD-10-CM | POA: Diagnosis not present

## 2019-05-01 DIAGNOSIS — M79605 Pain in left leg: Secondary | ICD-10-CM | POA: Diagnosis not present

## 2019-05-01 DIAGNOSIS — M79652 Pain in left thigh: Secondary | ICD-10-CM | POA: Diagnosis not present

## 2019-05-01 DIAGNOSIS — M79604 Pain in right leg: Secondary | ICD-10-CM | POA: Diagnosis not present

## 2019-05-01 DIAGNOSIS — M79651 Pain in right thigh: Secondary | ICD-10-CM | POA: Diagnosis not present

## 2019-07-03 DIAGNOSIS — Z6823 Body mass index (BMI) 23.0-23.9, adult: Secondary | ICD-10-CM | POA: Diagnosis not present

## 2019-07-03 DIAGNOSIS — J019 Acute sinusitis, unspecified: Secondary | ICD-10-CM | POA: Diagnosis not present

## 2019-10-26 DIAGNOSIS — Z20822 Contact with and (suspected) exposure to covid-19: Secondary | ICD-10-CM | POA: Diagnosis not present

## 2019-11-09 ENCOUNTER — Telehealth: Payer: Self-pay | Admitting: Orthopaedic Surgery

## 2019-11-09 NOTE — Telephone Encounter (Signed)
Asked that Ezzie Dural give her; says she was an old co-worker   [phone]

## 2019-11-09 NOTE — Telephone Encounter (Signed)
called

## 2019-12-04 DIAGNOSIS — Z6823 Body mass index (BMI) 23.0-23.9, adult: Secondary | ICD-10-CM | POA: Diagnosis not present

## 2019-12-04 DIAGNOSIS — N390 Urinary tract infection, site not specified: Secondary | ICD-10-CM | POA: Diagnosis not present

## 2019-12-17 DIAGNOSIS — N3000 Acute cystitis without hematuria: Secondary | ICD-10-CM | POA: Diagnosis not present

## 2019-12-17 DIAGNOSIS — Z6823 Body mass index (BMI) 23.0-23.9, adult: Secondary | ICD-10-CM | POA: Diagnosis not present

## 2020-01-24 DIAGNOSIS — Z01419 Encounter for gynecological examination (general) (routine) without abnormal findings: Secondary | ICD-10-CM | POA: Diagnosis not present

## 2020-01-24 DIAGNOSIS — R87610 Atypical squamous cells of undetermined significance on cytologic smear of cervix (ASC-US): Secondary | ICD-10-CM | POA: Diagnosis not present

## 2020-01-24 DIAGNOSIS — Z6824 Body mass index (BMI) 24.0-24.9, adult: Secondary | ICD-10-CM | POA: Diagnosis not present

## 2020-01-24 DIAGNOSIS — Z1151 Encounter for screening for human papillomavirus (HPV): Secondary | ICD-10-CM | POA: Diagnosis not present

## 2020-08-19 DIAGNOSIS — I950 Idiopathic hypotension: Secondary | ICD-10-CM | POA: Diagnosis not present

## 2020-08-19 DIAGNOSIS — R748 Abnormal levels of other serum enzymes: Secondary | ICD-10-CM | POA: Diagnosis not present

## 2021-01-13 DIAGNOSIS — J069 Acute upper respiratory infection, unspecified: Secondary | ICD-10-CM | POA: Diagnosis not present

## 2021-02-16 DIAGNOSIS — H8111 Benign paroxysmal vertigo, right ear: Secondary | ICD-10-CM | POA: Diagnosis not present

## 2021-02-25 DIAGNOSIS — N3000 Acute cystitis without hematuria: Secondary | ICD-10-CM | POA: Diagnosis not present

## 2021-02-25 DIAGNOSIS — R69 Illness, unspecified: Secondary | ICD-10-CM | POA: Diagnosis not present

## 2021-04-08 DIAGNOSIS — Z0142 Encounter for cervical smear to confirm findings of recent normal smear following initial abnormal smear: Secondary | ICD-10-CM | POA: Diagnosis not present

## 2021-04-08 DIAGNOSIS — Z124 Encounter for screening for malignant neoplasm of cervix: Secondary | ICD-10-CM | POA: Diagnosis not present

## 2021-04-08 DIAGNOSIS — Z01419 Encounter for gynecological examination (general) (routine) without abnormal findings: Secondary | ICD-10-CM | POA: Diagnosis not present

## 2021-04-08 DIAGNOSIS — Z6824 Body mass index (BMI) 24.0-24.9, adult: Secondary | ICD-10-CM | POA: Diagnosis not present

## 2021-04-08 DIAGNOSIS — Z3041 Encounter for surveillance of contraceptive pills: Secondary | ICD-10-CM | POA: Diagnosis not present

## 2021-08-17 DIAGNOSIS — N3011 Interstitial cystitis (chronic) with hematuria: Secondary | ICD-10-CM | POA: Diagnosis not present

## 2021-09-15 DIAGNOSIS — R1032 Left lower quadrant pain: Secondary | ICD-10-CM | POA: Diagnosis not present

## 2021-09-15 DIAGNOSIS — R1031 Right lower quadrant pain: Secondary | ICD-10-CM | POA: Diagnosis not present

## 2021-09-15 DIAGNOSIS — Z79899 Other long term (current) drug therapy: Secondary | ICD-10-CM | POA: Diagnosis not present

## 2021-09-15 DIAGNOSIS — R3982 Chronic bladder pain: Secondary | ICD-10-CM | POA: Diagnosis not present

## 2021-09-17 ENCOUNTER — Other Ambulatory Visit: Payer: Self-pay | Admitting: Family Medicine

## 2021-10-26 ENCOUNTER — Other Ambulatory Visit: Payer: Self-pay | Admitting: Family Medicine

## 2021-10-26 DIAGNOSIS — R1031 Right lower quadrant pain: Secondary | ICD-10-CM

## 2021-10-26 DIAGNOSIS — R1032 Left lower quadrant pain: Secondary | ICD-10-CM

## 2021-10-26 DIAGNOSIS — R1011 Right upper quadrant pain: Secondary | ICD-10-CM

## 2021-10-26 DIAGNOSIS — R1012 Left upper quadrant pain: Secondary | ICD-10-CM

## 2022-04-17 DIAGNOSIS — J111 Influenza due to unidentified influenza virus with other respiratory manifestations: Secondary | ICD-10-CM | POA: Diagnosis not present

## 2022-04-17 DIAGNOSIS — M791 Myalgia, unspecified site: Secondary | ICD-10-CM | POA: Diagnosis not present

## 2022-04-17 DIAGNOSIS — R051 Acute cough: Secondary | ICD-10-CM | POA: Diagnosis not present

## 2022-05-26 DIAGNOSIS — R310 Gross hematuria: Secondary | ICD-10-CM | POA: Diagnosis not present

## 2022-05-26 DIAGNOSIS — R3982 Chronic bladder pain: Secondary | ICD-10-CM | POA: Diagnosis not present

## 2022-05-26 DIAGNOSIS — R1031 Right lower quadrant pain: Secondary | ICD-10-CM | POA: Diagnosis not present

## 2022-06-21 DIAGNOSIS — R310 Gross hematuria: Secondary | ICD-10-CM | POA: Diagnosis not present

## 2022-07-01 DIAGNOSIS — R1031 Right lower quadrant pain: Secondary | ICD-10-CM | POA: Diagnosis not present

## 2022-07-01 DIAGNOSIS — R310 Gross hematuria: Secondary | ICD-10-CM | POA: Diagnosis not present

## 2022-07-01 DIAGNOSIS — R3982 Chronic bladder pain: Secondary | ICD-10-CM | POA: Diagnosis not present

## 2022-07-08 DIAGNOSIS — R208 Other disturbances of skin sensation: Secondary | ICD-10-CM | POA: Diagnosis not present

## 2022-07-08 DIAGNOSIS — Z01411 Encounter for gynecological examination (general) (routine) with abnormal findings: Secondary | ICD-10-CM | POA: Diagnosis not present

## 2023-01-18 DIAGNOSIS — J069 Acute upper respiratory infection, unspecified: Secondary | ICD-10-CM | POA: Diagnosis not present

## 2023-01-18 DIAGNOSIS — Z6824 Body mass index (BMI) 24.0-24.9, adult: Secondary | ICD-10-CM | POA: Diagnosis not present

## 2023-02-09 DIAGNOSIS — Z1331 Encounter for screening for depression: Secondary | ICD-10-CM | POA: Diagnosis not present

## 2023-02-09 DIAGNOSIS — T7840XA Allergy, unspecified, initial encounter: Secondary | ICD-10-CM | POA: Diagnosis not present

## 2023-02-09 DIAGNOSIS — Z1339 Encounter for screening examination for other mental health and behavioral disorders: Secondary | ICD-10-CM | POA: Diagnosis not present

## 2023-02-09 DIAGNOSIS — Z6824 Body mass index (BMI) 24.0-24.9, adult: Secondary | ICD-10-CM | POA: Diagnosis not present

## 2023-08-29 DIAGNOSIS — Z01419 Encounter for gynecological examination (general) (routine) without abnormal findings: Secondary | ICD-10-CM | POA: Diagnosis not present

## 2023-08-29 DIAGNOSIS — Z1331 Encounter for screening for depression: Secondary | ICD-10-CM | POA: Diagnosis not present

## 2023-11-30 DIAGNOSIS — H47333 Pseudopapilledema of optic disc, bilateral: Secondary | ICD-10-CM | POA: Diagnosis not present

## 2023-11-30 DIAGNOSIS — G43109 Migraine with aura, not intractable, without status migrainosus: Secondary | ICD-10-CM | POA: Diagnosis not present
